# Patient Record
Sex: Female | Born: 1965 | Race: Black or African American | Hispanic: Refuse to answer | Smoking: Never smoker
Health system: Southern US, Community
[De-identification: ages and names within clinical notes are randomized; demographics above are authoritative.]

## PROBLEM LIST (undated history)

## (undated) DIAGNOSIS — F419 Anxiety disorder, unspecified: Secondary | ICD-10-CM

## (undated) DIAGNOSIS — D219 Benign neoplasm of connective and other soft tissue, unspecified: Secondary | ICD-10-CM

## (undated) DIAGNOSIS — E559 Vitamin D deficiency, unspecified: Secondary | ICD-10-CM

## (undated) DIAGNOSIS — I10 Essential (primary) hypertension: Secondary | ICD-10-CM

## (undated) DIAGNOSIS — T7840XA Allergy, unspecified, initial encounter: Secondary | ICD-10-CM

## (undated) DIAGNOSIS — Z9109 Other allergy status, other than to drugs and biological substances: Secondary | ICD-10-CM

## (undated) DIAGNOSIS — N879 Dysplasia of cervix uteri, unspecified: Secondary | ICD-10-CM

## (undated) HISTORY — DX: Dysplasia of cervix uteri, unspecified: N87.9

## (undated) HISTORY — DX: Benign neoplasm of connective and other soft tissue, unspecified: D21.9

## (undated) HISTORY — DX: Allergy, unspecified, initial encounter: T78.40XA

## (undated) HISTORY — DX: Vitamin D deficiency, unspecified: E55.9

## (undated) HISTORY — PX: APPENDECTOMY: SHX54

## (undated) HISTORY — DX: Essential (primary) hypertension: I10

## (undated) HISTORY — DX: Other allergy status, other than to drugs and biological substances: Z91.09

---

## 1990-05-04 HISTORY — PX: LEEP: SHX91

## 1998-01-15 ENCOUNTER — Other Ambulatory Visit: Admission: RE | Admit: 1998-01-15 | Discharge: 1998-01-15 | Payer: Self-pay | Admitting: Obstetrics and Gynecology

## 1999-03-14 ENCOUNTER — Other Ambulatory Visit: Admission: RE | Admit: 1999-03-14 | Discharge: 1999-03-14 | Payer: Self-pay | Admitting: Gynecology

## 1999-09-22 ENCOUNTER — Other Ambulatory Visit: Admission: RE | Admit: 1999-09-22 | Discharge: 1999-09-22 | Payer: Self-pay | Admitting: Gynecology

## 2001-04-19 ENCOUNTER — Other Ambulatory Visit: Admission: RE | Admit: 2001-04-19 | Discharge: 2001-04-19 | Payer: Self-pay | Admitting: Gynecology

## 2002-05-16 ENCOUNTER — Other Ambulatory Visit: Admission: RE | Admit: 2002-05-16 | Discharge: 2002-05-16 | Payer: Self-pay | Admitting: Gynecology

## 2003-03-06 ENCOUNTER — Other Ambulatory Visit: Admission: RE | Admit: 2003-03-06 | Discharge: 2003-03-06 | Payer: Self-pay | Admitting: Gynecology

## 2004-03-11 ENCOUNTER — Other Ambulatory Visit: Admission: RE | Admit: 2004-03-11 | Discharge: 2004-03-11 | Payer: Self-pay | Admitting: Gynecology

## 2005-03-13 ENCOUNTER — Other Ambulatory Visit: Admission: RE | Admit: 2005-03-13 | Discharge: 2005-03-13 | Payer: Self-pay | Admitting: Gynecology

## 2006-03-16 ENCOUNTER — Other Ambulatory Visit: Admission: RE | Admit: 2006-03-16 | Discharge: 2006-03-16 | Payer: Self-pay | Admitting: Gynecology

## 2007-03-18 ENCOUNTER — Other Ambulatory Visit: Admission: RE | Admit: 2007-03-18 | Discharge: 2007-03-18 | Payer: Self-pay | Admitting: Gynecology

## 2008-04-02 ENCOUNTER — Other Ambulatory Visit: Admission: RE | Admit: 2008-04-02 | Discharge: 2008-04-02 | Payer: Self-pay | Admitting: Gynecology

## 2008-04-02 ENCOUNTER — Encounter: Payer: Self-pay | Admitting: Women's Health

## 2008-04-02 ENCOUNTER — Ambulatory Visit: Payer: Self-pay | Admitting: Women's Health

## 2009-04-03 ENCOUNTER — Other Ambulatory Visit: Admission: RE | Admit: 2009-04-03 | Discharge: 2009-04-03 | Payer: Self-pay | Admitting: Gynecology

## 2009-04-03 ENCOUNTER — Ambulatory Visit: Payer: Self-pay | Admitting: Women's Health

## 2010-08-29 ENCOUNTER — Encounter: Payer: Self-pay | Admitting: *Deleted

## 2010-09-01 ENCOUNTER — Other Ambulatory Visit (HOSPITAL_COMMUNITY)
Admission: RE | Admit: 2010-09-01 | Discharge: 2010-09-01 | Disposition: A | Discharge status: Home / Self Care | Payer: BC Managed Care – PPO | Source: Ambulatory Visit | Attending: Obstetrics and Gynecology | Admitting: Obstetrics and Gynecology

## 2010-09-01 ENCOUNTER — Encounter (INDEPENDENT_AMBULATORY_CARE_PROVIDER_SITE_OTHER): Payer: BC Managed Care – PPO | Admitting: Obstetrics and Gynecology

## 2010-09-01 ENCOUNTER — Other Ambulatory Visit: Payer: Self-pay | Admitting: Obstetrics and Gynecology

## 2010-09-01 DIAGNOSIS — Z124 Encounter for screening for malignant neoplasm of cervix: Secondary | ICD-10-CM | POA: Insufficient documentation

## 2010-09-01 DIAGNOSIS — Z01419 Encounter for gynecological examination (general) (routine) without abnormal findings: Secondary | ICD-10-CM

## 2010-09-01 DIAGNOSIS — N912 Amenorrhea, unspecified: Secondary | ICD-10-CM

## 2010-09-02 ENCOUNTER — Ambulatory Visit (INDEPENDENT_AMBULATORY_CARE_PROVIDER_SITE_OTHER): Payer: BC Managed Care – PPO | Admitting: Obstetrics and Gynecology

## 2010-09-02 ENCOUNTER — Other Ambulatory Visit: Payer: BC Managed Care – PPO

## 2010-09-02 DIAGNOSIS — N831 Corpus luteum cyst of ovary, unspecified side: Secondary | ICD-10-CM

## 2010-09-02 DIAGNOSIS — D251 Intramural leiomyoma of uterus: Secondary | ICD-10-CM

## 2010-09-02 DIAGNOSIS — D259 Leiomyoma of uterus, unspecified: Secondary | ICD-10-CM

## 2010-10-03 HISTORY — PX: INTRAUTERINE DEVICE INSERTION: SHX323

## 2010-10-13 ENCOUNTER — Ambulatory Visit (INDEPENDENT_AMBULATORY_CARE_PROVIDER_SITE_OTHER): Payer: BC Managed Care – PPO | Admitting: Obstetrics and Gynecology

## 2010-10-13 ENCOUNTER — Other Ambulatory Visit: Payer: BC Managed Care – PPO

## 2010-10-13 DIAGNOSIS — D252 Subserosal leiomyoma of uterus: Secondary | ICD-10-CM

## 2010-10-13 DIAGNOSIS — Z30431 Encounter for routine checking of intrauterine contraceptive device: Secondary | ICD-10-CM

## 2010-10-13 DIAGNOSIS — T8339XA Other mechanical complication of intrauterine contraceptive device, initial encounter: Secondary | ICD-10-CM

## 2010-10-13 DIAGNOSIS — D259 Leiomyoma of uterus, unspecified: Secondary | ICD-10-CM

## 2010-10-20 ENCOUNTER — Ambulatory Visit: Payer: BC Managed Care – PPO | Admitting: Obstetrics and Gynecology

## 2010-10-21 ENCOUNTER — Ambulatory Visit (INDEPENDENT_AMBULATORY_CARE_PROVIDER_SITE_OTHER): Payer: BC Managed Care – PPO | Admitting: Obstetrics and Gynecology

## 2010-10-21 DIAGNOSIS — N710 Acute inflammatory disease of uterus: Secondary | ICD-10-CM

## 2010-10-21 DIAGNOSIS — R82998 Other abnormal findings in urine: Secondary | ICD-10-CM

## 2010-10-21 DIAGNOSIS — N390 Urinary tract infection, site not specified: Secondary | ICD-10-CM

## 2010-11-25 ENCOUNTER — Encounter: Payer: Self-pay | Admitting: Obstetrics and Gynecology

## 2010-11-25 ENCOUNTER — Ambulatory Visit (INDEPENDENT_AMBULATORY_CARE_PROVIDER_SITE_OTHER): Payer: BC Managed Care – PPO | Admitting: Obstetrics and Gynecology

## 2010-11-25 DIAGNOSIS — N341 Nonspecific urethritis: Secondary | ICD-10-CM | POA: Insufficient documentation

## 2010-11-25 DIAGNOSIS — D251 Intramural leiomyoma of uterus: Secondary | ICD-10-CM

## 2010-11-25 DIAGNOSIS — N946 Dysmenorrhea, unspecified: Secondary | ICD-10-CM | POA: Insufficient documentation

## 2010-11-25 NOTE — Progress Notes (Signed)
The patient came back today for further follow up of her IUD. In addition we wanted to be sure that her urethritis had been resolved with Cipro. The patient told me today that she is very happy with her IUD and her last period was almost nonexistent. She's also found that her cramps are much better. She also stated that her urethral symptoms have completely resolved with a Cipro.  Abdomen is soft without guarding rebound or masses External genitalia within normal limits BUS within normal limits. Vaginal exam within normal limits. Cervix is clean and IUD string is visible. The uterus is still enlarged by myomas to about 10-11 weeks. Adnexa failed to reveal masses. Assessment. Symptoms are much improved with IUD. Urethritis now resolved. Plan patient instructed on how to feel the IUD string. Patient will return at normal time.

## 2011-09-11 ENCOUNTER — Ambulatory Visit (INDEPENDENT_AMBULATORY_CARE_PROVIDER_SITE_OTHER): Payer: PRIVATE HEALTH INSURANCE | Admitting: Obstetrics and Gynecology

## 2011-09-11 ENCOUNTER — Encounter: Payer: Self-pay | Admitting: Obstetrics and Gynecology

## 2011-09-11 ENCOUNTER — Other Ambulatory Visit (HOSPITAL_COMMUNITY)
Admission: RE | Admit: 2011-09-11 | Discharge: 2011-09-11 | Disposition: A | Discharge status: Home / Self Care | Payer: PRIVATE HEALTH INSURANCE | Source: Ambulatory Visit | Attending: Obstetrics and Gynecology | Admitting: Obstetrics and Gynecology

## 2011-09-11 VITALS — BP 132/82 | Ht 64.5 in | Wt 126.0 lb

## 2011-09-11 DIAGNOSIS — Z01419 Encounter for gynecological examination (general) (routine) without abnormal findings: Secondary | ICD-10-CM

## 2011-09-11 DIAGNOSIS — D259 Leiomyoma of uterus, unspecified: Secondary | ICD-10-CM

## 2011-09-11 DIAGNOSIS — D219 Benign neoplasm of connective and other soft tissue, unspecified: Secondary | ICD-10-CM

## 2011-09-11 LAB — CBC WITH DIFFERENTIAL/PLATELET
Basophils Absolute: 0 10*3/uL (ref 0.0–0.1)
Eosinophils Relative: 2 % (ref 0–5)
HCT: 44.2 % (ref 36.0–46.0)
Lymphocytes Relative: 33 % (ref 12–46)
Lymphs Abs: 2 10*3/uL (ref 0.7–4.0)
MCV: 91.3 fL (ref 78.0–100.0)
Monocytes Absolute: 0.4 10*3/uL (ref 0.1–1.0)
Neutro Abs: 3.7 10*3/uL (ref 1.7–7.7)
RBC: 4.84 MIL/uL (ref 3.87–5.11)
RDW: 13.2 % (ref 11.5–15.5)
WBC: 6.2 10*3/uL (ref 4.0–10.5)

## 2011-09-11 LAB — LIPID PANEL: LDL Cholesterol: 119 mg/dL — ABNORMAL HIGH (ref 0–99)

## 2011-09-11 MED ORDER — CITALOPRAM HYDROBROMIDE 20 MG PO TABS: 20.0000 mg | ORAL_TABLET | Freq: Every day | ORAL | Status: DC

## 2011-09-11 MED ORDER — AZITHROMYCIN 250 MG PO TABS: ORAL_TABLET | ORAL | Status: DC

## 2011-09-11 MED ORDER — SPIRONOLACTONE 50 MG PO TABS: 50.0000 mg | ORAL_TABLET | Freq: Every day | ORAL | Status: DC

## 2011-09-11 NOTE — Progress Notes (Signed)
Patient came to see me today for her annual GYN exam. Since we have placed her Mirena IUD her severe menorrhagia and dysmenorrhea has been well-controlled. She has light spotting every 60 days without cramping. She is having no pelvic pain. 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 Since then her Pap smears have been normal. She is having a mammogram today. She is having no other GYN complaints.  Physical examination: Sharon Conrad present. HEENT within normal limits. Neck: Thyroid not large. No masses. Supraclavicular nodes: not enlarged. Breasts: Examined in both sitting and lying  position. No skin changes and no masses. Abdomen: Soft no guarding rebound or masses or hernia. Pelvic: External: Within normal limits. BUS: Within normal limits. Vaginal:within normal limits. Good estrogen effect. No evidence of cystocele rectocele or enterocele. Cervix: clean. IUD string visible. Uterus:enlarged by fibroids to 9-10 week size. Adnexa: No masses. Rectovaginal exam: Confirmatory and negative. Extremities: Within normal limits.  Assessment: Menorrhagia and dysmenorrhea with fibroids-well-controlled with Mirena IUD. CIN-1-status post LEEP.  Plan: Mammogram. Continue Mirena IUD.

## 2011-09-17 ENCOUNTER — Encounter: Payer: Self-pay | Admitting: Obstetrics and Gynecology

## 2011-11-26 ENCOUNTER — Ambulatory Visit (INDEPENDENT_AMBULATORY_CARE_PROVIDER_SITE_OTHER): Payer: PRIVATE HEALTH INSURANCE | Admitting: Physician Assistant

## 2011-11-26 VITALS — BP 110/82 | HR 81 | Temp 98.9°F | Resp 16 | Ht 65.75 in | Wt 129.0 lb

## 2011-11-26 DIAGNOSIS — R059 Cough, unspecified: Secondary | ICD-10-CM

## 2011-11-26 DIAGNOSIS — J040 Acute laryngitis: Secondary | ICD-10-CM

## 2011-11-26 DIAGNOSIS — R05 Cough: Secondary | ICD-10-CM

## 2011-11-26 DIAGNOSIS — J029 Acute pharyngitis, unspecified: Secondary | ICD-10-CM

## 2011-11-26 LAB — POCT RAPID STREP A (OFFICE): Rapid Strep A Screen: NEGATIVE

## 2011-11-26 MED ORDER — AZITHROMYCIN 250 MG PO TABS: ORAL_TABLET | ORAL | Status: AC

## 2011-11-26 MED ORDER — RANITIDINE HCL 150 MG PO TABS: 150.0000 mg | ORAL_TABLET | Freq: Two times a day (BID) | ORAL | Status: DC

## 2011-11-26 MED ORDER — HYDROCODONE-HOMATROPINE 5-1.5 MG/5ML PO SYRP: 5.0000 mL | ORAL_SOLUTION | Freq: Three times a day (TID) | ORAL | Status: AC | PRN

## 2011-11-26 NOTE — Progress Notes (Signed)
  Subjective:    Patient ID: Sharon Conrad, female    DOB: 1965-08-30, 46 y.o.   MRN: [id number]  HPI 46 year old female with 1 day history of sore throat, nasal congestion, slight dry cough, and laryngitis.  She also complains of some ear pressure and fullness.  No fever, chills, nausea, vomiting, headache, or abdominal pain.  She took Zyrtec this a.m as well as used her flonase.  She also admits to having a burning sensation in her throat at night. This is new in onset and she has woken up several times with this sensation and a slight cough.     Review of Systems  All other systems reviewed and are negative.       Objective:   Physical Exam  Constitutional: She is oriented to person, place, and time. She appears well-developed and well-nourished.  HENT:  Head: Normocephalic and atraumatic.  Right Ear: Hearing, tympanic membrane, external ear and ear canal normal.  Left Ear: Hearing, tympanic membrane, external ear and ear canal normal.  Mouth/Throat: Mucous membranes are normal. Posterior oropharyngeal erythema (slight clear postnasal drip) present. No oropharyngeal exudate.  Eyes: Conjunctivae are normal.  Neck: Normal range of motion.  Cardiovascular: Normal rate, regular rhythm and normal heart sounds.   Pulmonary/Chest: Effort normal and breath sounds normal.  Lymphadenopathy:    She has no cervical adenopathy.  Neurological: She is alert and oriented to person, place, and time.  Psychiatric: She has a normal mood and affect. Her behavior is normal. Thought content normal.          Assessment & Plan:   1. Acute pharyngitis  POCT rapid strep A, azithromycin (ZITHROMAX) 250 MG tablet  2. Cough  HYDROcodone-homatropine (HYCODAN) 5-1.5 MG/5ML syrup  3. Laryngitis     Take Hycodan as needed for cough.   Continue Flonase and Zyrtec May start Z-pack in 24-48 hours if no improvement.  Also will start Zantac bid to treat possible GERD.

## 2011-12-11 ENCOUNTER — Ambulatory Visit (INDEPENDENT_AMBULATORY_CARE_PROVIDER_SITE_OTHER): Payer: PRIVATE HEALTH INSURANCE | Admitting: Internal Medicine

## 2011-12-11 VITALS — BP 144/82 | HR 77 | Temp 97.8°F | Resp 16 | Ht 65.0 in | Wt 131.0 lb

## 2011-12-11 DIAGNOSIS — J040 Acute laryngitis: Secondary | ICD-10-CM

## 2011-12-11 DIAGNOSIS — B309 Viral conjunctivitis, unspecified: Secondary | ICD-10-CM

## 2011-12-11 MED ORDER — PREDNISONE 20 MG PO TABS: 20.0000 mg | ORAL_TABLET | Freq: Every day | ORAL | Status: AC

## 2011-12-11 NOTE — Progress Notes (Signed)
  Subjective:    Patient ID: Sharon Conrad, female    DOB: 11-Apr-1966, 46 y.o.   MRN: [id number]  HPI Laryngitis  Onset 1 week ago Mild no assoc symptoms except mild prickly feeling sore throat Improving but still hoarse and wants to be sure she does not need antibiotics.no coughno fever no reflux symptoms taking Flonase and otc allergy med   Review of Systems  Constitutional: Negative.   HENT: Positive for postnasal drip.   Eyes: Negative.   Respiratory: Negative.   Cardiovascular: Negative.   Gastrointestinal: Negative.   Genitourinary: Negative.   Musculoskeletal: Negative.   Skin: Negative.   Neurological: Negative.   Hematological: Negative.   Psychiatric/Behavioral: Negative.   All other systems reviewed and are negative.       Objective:   Physical Exam  Nursing note and vitals reviewed. Constitutional: She is oriented to person, place, and time. She appears well-developed and well-nourished.       Mild largngitis able to vocalize with scrathcy sound  HENT:  Head: Normocephalic and atraumatic.  Right Ear: External ear normal.  Left Ear: External ear normal.  Nose: Nose normal.  Mouth/Throat: Oropharynx is clear and moist.  Eyes: Conjunctivae and EOM are normal. Pupils are equal, round, and reactive to light.  Neck: Normal range of motion. Neck supple.  Cardiovascular: Normal rate, regular rhythm and normal heart sounds.   Pulmonary/Chest: Effort normal and breath sounds normal.  Abdominal: Soft. Bowel sounds are normal.  Musculoskeletal: Normal range of motion.  Neurological: She is alert and oriented to person, place, and time. She has normal reflexes.  Skin: Skin is warm and dry.  Psychiatric: She has a normal mood and affect. Her behavior is normal. Judgment and thought content normal.          Assessment & Plan:  Laryngitis Rx prednisone as directed prilosec as directed otc Vocal rest

## 2011-12-11 NOTE — Patient Instructions (Signed)
Take meds as directed

## 2012-09-19 ENCOUNTER — Other Ambulatory Visit (HOSPITAL_COMMUNITY)
Admission: RE | Admit: 2012-09-19 | Discharge: 2012-09-19 | Disposition: A | Discharge status: Home / Self Care | Payer: BC Managed Care – PPO | Source: Ambulatory Visit | Attending: Gynecology | Admitting: Gynecology

## 2012-09-19 ENCOUNTER — Encounter: Payer: Self-pay | Admitting: Gynecology

## 2012-09-19 ENCOUNTER — Ambulatory Visit (INDEPENDENT_AMBULATORY_CARE_PROVIDER_SITE_OTHER): Payer: BC Managed Care – PPO | Admitting: Gynecology

## 2012-09-19 VITALS — BP 146/90 | Ht 64.25 in | Wt 137.0 lb

## 2012-09-19 DIAGNOSIS — Z23 Encounter for immunization: Secondary | ICD-10-CM

## 2012-09-19 DIAGNOSIS — L708 Other acne: Secondary | ICD-10-CM

## 2012-09-19 DIAGNOSIS — F3289 Other specified depressive episodes: Secondary | ICD-10-CM

## 2012-09-19 DIAGNOSIS — Z1159 Encounter for screening for other viral diseases: Secondary | ICD-10-CM

## 2012-09-19 DIAGNOSIS — Z1151 Encounter for screening for human papillomavirus (HPV): Secondary | ICD-10-CM | POA: Insufficient documentation

## 2012-09-19 DIAGNOSIS — R635 Abnormal weight gain: Secondary | ICD-10-CM

## 2012-09-19 DIAGNOSIS — Z87410 Personal history of cervical dysplasia: Secondary | ICD-10-CM | POA: Insufficient documentation

## 2012-09-19 DIAGNOSIS — F329 Major depressive disorder, single episode, unspecified: Secondary | ICD-10-CM

## 2012-09-19 DIAGNOSIS — Z01419 Encounter for gynecological examination (general) (routine) without abnormal findings: Secondary | ICD-10-CM

## 2012-09-19 DIAGNOSIS — Z800 Family history of malignant neoplasm of digestive organs: Secondary | ICD-10-CM

## 2012-09-19 DIAGNOSIS — F32A Depression, unspecified: Secondary | ICD-10-CM | POA: Insufficient documentation

## 2012-09-19 DIAGNOSIS — L709 Acne, unspecified: Secondary | ICD-10-CM

## 2012-09-19 DIAGNOSIS — D259 Leiomyoma of uterus, unspecified: Secondary | ICD-10-CM

## 2012-09-19 LAB — CBC WITH DIFFERENTIAL/PLATELET
Basophils Relative: 0 % (ref 0–1)
Eosinophils Absolute: 0.2 10*3/uL (ref 0.0–0.7)
Lymphs Abs: 1.4 10*3/uL (ref 0.7–4.0)
MCH: 30.4 pg (ref 26.0–34.0)
Neutrophils Relative %: 71 % (ref 43–77)
Platelets: 182 10*3/uL (ref 150–400)
RBC: 4.81 MIL/uL (ref 3.87–5.11)

## 2012-09-19 LAB — COMPREHENSIVE METABOLIC PANEL
ALT: 12 U/L (ref 0–35)
AST: 19 U/L (ref 0–37)
Alkaline Phosphatase: 54 U/L (ref 39–117)
Glucose, Bld: 75 mg/dL (ref 70–99)
Sodium: 138 mEq/L (ref 135–145)
Total Bilirubin: 0.4 mg/dL (ref 0.3–1.2)
Total Protein: 7 g/dL (ref 6.0–8.3)

## 2012-09-19 LAB — LIPID PANEL
Cholesterol: 208 mg/dL — ABNORMAL HIGH (ref 0–200)
LDL Cholesterol: 131 mg/dL — ABNORMAL HIGH (ref 0–99)
Total CHOL/HDL Ratio: 3.3 Ratio
VLDL: 13 mg/dL (ref 0–40)

## 2012-09-19 LAB — HEPATITIS C ANTIBODY: HCV Ab: NEGATIVE

## 2012-09-19 MED ORDER — SPIRONOLACTONE 50 MG PO TABS: 50.0000 mg | ORAL_TABLET | Freq: Every day | ORAL | Status: DC

## 2012-09-19 MED ORDER — CITALOPRAM HYDROBROMIDE 20 MG PO TABS: 20.0000 mg | ORAL_TABLET | Freq: Every day | ORAL | Status: DC

## 2012-09-19 NOTE — Progress Notes (Signed)
Sharon Conrad 05-28-1965 [id number]   History:    47 y.o.  for annual gyn exam with no major complaints. Patient had a Mirena IUD placed in 2012. She has minimal if any menstrual cycle. She had complained of some weight gain since last year. 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 Since then her Pap smears have been normal. Patient had a mammogram in 2013 which demonstrated she had extremely dense breasts and is scheduled for mammogram later this week. She does her monthly breast examination. She takes Aldactone 50 mg daily for her acne and she also takes Cymbalta 20 mg daily for depression. Her mother was diagnosed with colon cancer at the age of 70. Patient would know prior colonoscopy. She does have history of fibroid uterus.   Past medical history,surgical history, family history and social history were all reviewed and documented in the EPIC chart.  Gynecologic History No LMP recorded. Patient is not currently having periods (Reason: IUD). Contraception: IUD Last Pap: 2013. Results were: normal Last mammogram: 2013. Results were: normal but dense  Obstetric History OB History   Grav Para Term Preterm Abortions TAB SAB Ect Mult Living   0                ROS: A ROS was performed and pertinent positives and negatives are included in the history.  GENERAL: No fevers or chills. HEENT: No change in vision, no earache, sore throat or sinus congestion. NECK: No pain or stiffness. CARDIOVASCULAR: No chest pain or pressure. No palpitations. PULMONARY: No shortness of breath, cough or wheeze. GASTROINTESTINAL: No abdominal pain, nausea, vomiting or diarrhea, melena or bright red blood per rectum. GENITOURINARY: No urinary frequency, urgency, hesitancy or dysuria. MUSCULOSKELETAL: No joint or muscle pain, no back pain, no recent trauma. DERMATOLOGIC: No rash, no itching, no lesions. ENDOCRINE: No polyuria, polydipsia, no heat or cold intolerance. No recent change  in weight. HEMATOLOGICAL: No anemia or easy bruising or bleeding. NEUROLOGIC: No headache, seizures, numbness, tingling or weakness. PSYCHIATRIC: No depression, no loss of interest in normal activity or change in sleep pattern.     Exam: chaperone present  BP 146/90  Ht 5' 4.25" (1.632 m)  Wt 137 lb (62.143 kg)  BMI 23.33 kg/m2  Body mass index is 23.33 kg/(m^2).  General appearance : Well developed well nourished female. No acute distress HEENT: Neck supple, trachea midline, no carotid bruits, no thyroidmegaly Lungs: Clear to auscultation, no rhonchi or wheezes, or rib retractions  Heart: Regular rate and rhythm, no murmurs or gallops Breast:Examined in sitting and supine position were symmetrical in appearance, no palpable masses or tenderness,  no skin retraction, no nipple inversion, no nipple discharge, no skin discoloration, no axillary or supraclavicular lymphadenopathy Abdomen: no palpable masses or tenderness, no rebound or guarding Extremities: no edema or skin discoloration or tenderness  Pelvic:  Bartholin, Urethra, Skene Glands: Within normal limits             Vagina: No gross lesions or discharge  Cervix: No gross lesions or discharge, IUD string not seen  Uterus  Slightly anteverted 8-10 week size irregular shaped,   Adnexa  Without masses or tenderness  Anus and perineum  normal   Rectovaginal  normal sphincter tone without palpated masses or tenderness             Hemoccult cards provided   New CDC guidelines is recommending patients be tested once in her lifetime  for hepatitis C antibody who were born between [birth year] through 1967. This was discussed with the patient today and has agreed to be tested today.  Assessment/Plan:  47 y.o. female for annual exam will be asked to return to the office in one week for followup ultrasound as to her fibroids and also to confirm positioning of the IUD since the string was not seen today. Patient was instructed that on her  mammogram to tell the radiologist that she would like a 3-D because of her dense breasts noted on mammogram in 2013. She was reminded to do her monthly self breast examination. Hemoccult card was provided for to submit to the office for testing. Colonoscopy will be schedule next year. The following labs were ordered today: Fasting lipid profile, CBC, TSH, comprehensive metabolic panel, CBC and Pap smear. Patient did receive the Tdap vaccine today and literature information was provided.    Ok Edwards MD, 9:31 AM 09/19/2012

## 2012-09-19 NOTE — Progress Notes (Signed)
No menses due to iud 

## 2012-09-19 NOTE — Patient Instructions (Signed)

## 2012-09-19 NOTE — Addendum Note (Signed)
Addended by: Bertram Savin A on: 09/19/2012 09:43 AM   Modules accepted: Orders

## 2012-09-20 ENCOUNTER — Encounter: Payer: Self-pay | Admitting: Obstetrics and Gynecology

## 2012-09-20 ENCOUNTER — Encounter: Payer: Self-pay | Admitting: Gynecology

## 2012-09-20 LAB — URINALYSIS W MICROSCOPIC + REFLEX CULTURE
Hgb urine dipstick: NEGATIVE
Leukocytes, UA: NEGATIVE
Nitrite: NEGATIVE
Protein, ur: NEGATIVE mg/dL
pH: 7 (ref 5.0–8.0)

## 2012-09-21 ENCOUNTER — Other Ambulatory Visit: Payer: Self-pay | Admitting: Gynecology

## 2012-09-21 ENCOUNTER — Telehealth: Payer: Self-pay

## 2012-09-21 DIAGNOSIS — E789 Disorder of lipoprotein metabolism, unspecified: Secondary | ICD-10-CM

## 2012-09-21 MED ORDER — FLUTICASONE PROPIONATE 50 MCG/ACT NA SUSP: 2.0000 | Freq: Every day | NASAL | Status: DC

## 2012-09-21 NOTE — Telephone Encounter (Signed)
E-scribed to pharmacy

## 2012-09-21 NOTE — Telephone Encounter (Signed)
OK to refill her Flonase

## 2012-09-21 NOTE — Telephone Encounter (Signed)
Patient had her yearly on Monday and forgot to ask you if you would refill her generic Flonase nasal spray.  Ok to refill?

## 2012-09-22 ENCOUNTER — Encounter: Payer: Self-pay | Admitting: Gynecology

## 2012-09-22 ENCOUNTER — Ambulatory Visit (INDEPENDENT_AMBULATORY_CARE_PROVIDER_SITE_OTHER): Payer: BC Managed Care – PPO | Admitting: Gynecology

## 2012-09-22 VITALS — BP 130/80 | Temp 98.5°F

## 2012-09-22 DIAGNOSIS — J019 Acute sinusitis, unspecified: Secondary | ICD-10-CM

## 2012-09-22 MED ORDER — FLUCONAZOLE 100 MG PO TABS: 100.0000 mg | ORAL_TABLET | Freq: Every day | ORAL | Status: DC

## 2012-09-22 MED ORDER — CEFUROXIME AXETIL 500 MG PO TABS: ORAL_TABLET | ORAL | Status: DC

## 2012-09-22 NOTE — Progress Notes (Signed)
Patient presented to the office today complaining of postnasal drainage and feeling stopped up and pressure behind her eyes. She denied any fever chills nausea or vomiting. Very minimal nonproductive cough.  Exam: HEENT: Tender frontal and maxillary sinuses. No oropharyngeal lesions. Ear canal and TM clear on both ears. No submandibular lymphadenopathy  Lungs: Clear to auscultation Rogers or wheezes Heart: Regular rate and rhythm no murmurs or gallops  Assessment/plan: Acute sinusitis and she'll be treated with Ceftin 500 mg one by mouth twice a day along with Flonase nasal spray to apply to each nostril twice a day.

## 2012-09-22 NOTE — Patient Instructions (Addendum)

## 2012-09-23 ENCOUNTER — Other Ambulatory Visit: Payer: Self-pay | Admitting: Gynecology

## 2012-09-23 ENCOUNTER — Telehealth: Payer: Self-pay

## 2012-09-23 MED ORDER — CEFUROXIME AXETIL 500 MG PO TABS: 500.0000 mg | ORAL_TABLET | Freq: Two times a day (BID) | ORAL | Status: DC

## 2012-09-23 NOTE — Telephone Encounter (Signed)
Please clarify with pharmacyas follows:  Ceftin 500 mg one by mouth twice a day  For one week (14) along with Flonase nasal spray to apply to each nostril twice a day when necessary.

## 2012-09-23 NOTE — Telephone Encounter (Signed)
Patient was in yesterday for sinus infection.  She went to pick up antibiotic Rx last night but Rx was for one pill.  She said she was fairly certain you had told her it would be for seven day.  Please advise.

## 2012-09-23 NOTE — Telephone Encounter (Signed)
New Rx sent to pharmacy and clarified with patient.

## 2012-10-03 ENCOUNTER — Telehealth: Payer: Self-pay

## 2012-10-03 ENCOUNTER — Other Ambulatory Visit: Payer: Self-pay | Admitting: Gynecology

## 2012-10-03 MED ORDER — AZITHROMYCIN 250 MG PO TABS: ORAL_TABLET | ORAL | Status: DC

## 2012-10-03 NOTE — Telephone Encounter (Deleted)
Patient was seen 09/22/12

## 2012-10-03 NOTE — Telephone Encounter (Signed)
Recommend Z-Pak. If continues then needs to followup with primary physician or urgent care.

## 2012-10-03 NOTE — Telephone Encounter (Signed)
Patient was seen by Dr. Glenetta Hew on 09/22/12 and diagnosed with sinus infection and prescribed Ceftin 500mg  bid x one week and Flonase spray. Patient has finished antibiotic but still feels she has sinus infection and is blowing green mucous.  She asked if she could have another week on antibiotic?

## 2012-10-03 NOTE — Telephone Encounter (Signed)
Patient advised. rx e-scribed.

## 2012-10-17 ENCOUNTER — Other Ambulatory Visit: Payer: BC Managed Care – PPO

## 2012-10-17 ENCOUNTER — Ambulatory Visit: Payer: BC Managed Care – PPO | Admitting: Gynecology

## 2012-10-18 ENCOUNTER — Other Ambulatory Visit: Payer: Self-pay | Admitting: Anesthesiology

## 2012-10-18 DIAGNOSIS — Z1211 Encounter for screening for malignant neoplasm of colon: Secondary | ICD-10-CM

## 2012-10-19 ENCOUNTER — Ambulatory Visit (INDEPENDENT_AMBULATORY_CARE_PROVIDER_SITE_OTHER): Payer: BC Managed Care – PPO

## 2012-10-19 ENCOUNTER — Encounter: Payer: Self-pay | Admitting: Gynecology

## 2012-10-19 ENCOUNTER — Ambulatory Visit (INDEPENDENT_AMBULATORY_CARE_PROVIDER_SITE_OTHER): Payer: BC Managed Care – PPO | Admitting: Gynecology

## 2012-10-19 VITALS — BP 118/70

## 2012-10-19 DIAGNOSIS — D251 Intramural leiomyoma of uterus: Secondary | ICD-10-CM

## 2012-10-19 DIAGNOSIS — D252 Subserosal leiomyoma of uterus: Secondary | ICD-10-CM

## 2012-10-19 DIAGNOSIS — D259 Leiomyoma of uterus, unspecified: Secondary | ICD-10-CM

## 2012-10-19 DIAGNOSIS — Z5189 Encounter for other specified aftercare: Secondary | ICD-10-CM

## 2012-10-19 DIAGNOSIS — T8332XD Displacement of intrauterine contraceptive device, subsequent encounter: Secondary | ICD-10-CM

## 2012-10-19 DIAGNOSIS — N852 Hypertrophy of uterus: Secondary | ICD-10-CM

## 2012-10-19 NOTE — Progress Notes (Signed)
47 year old patient was seen in the office on May 19 for her annual exam with no complaints.patient had a Mirena IUD placed in 2012. She has minimal if any menstrual cycle. She had complained of some weight gain since last year. 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 Since then her Pap smears have been normal. The patient went known history of leiomyomatous uteri was asked to come to the office today for an ultrasound to compare with previous studies and to confirm placement of the IUD.  Ultrasound: Uterus measuring 10.3 x 9.1 x 6.1 cm with endometrial stripe at 2.8 mm. Patient with several intramural and subserosal myomas. Her largest fibroid was a left pedunculated fibroid measuring 4.2 x 3.2 cm. Both right and left ovary were normal. IUD was in normal position.  Patient's recent Pap smear, comprehensive metabolic panel, TSH, hepatitis C, urinalysis, blood sugar were all normal. Her lipid profile did demonstrate slight elevation in LDL was evaluated 31 and total cholesterol 208. Patient currently works on a diet and exercise and will have a fasting lipid profile in 6 months.  Assessment/plan: Patient with a symptomatic leiomyomatous uteri. Ultrasound compared with several years ago. The fibroids are beginning to get smaller since she is approaching the perimenopause. IUD was in place. Patient otherwise scheduled to return back in 1 year or when necessary.

## 2013-09-20 ENCOUNTER — Encounter: Payer: BC Managed Care – PPO | Admitting: Gynecology

## 2013-09-21 ENCOUNTER — Ambulatory Visit (INDEPENDENT_AMBULATORY_CARE_PROVIDER_SITE_OTHER): Payer: BC Managed Care – PPO | Admitting: Gynecology

## 2013-09-21 ENCOUNTER — Encounter: Payer: Self-pay | Admitting: Gynecology

## 2013-09-21 ENCOUNTER — Other Ambulatory Visit (HOSPITAL_COMMUNITY)
Admission: RE | Admit: 2013-09-21 | Discharge: 2013-09-21 | Disposition: A | Discharge status: Home / Self Care | Payer: BC Managed Care – PPO | Source: Ambulatory Visit | Attending: Gynecology | Admitting: Gynecology

## 2013-09-21 VITALS — BP 138/82 | Ht 64.5 in | Wt 134.0 lb

## 2013-09-21 DIAGNOSIS — F411 Generalized anxiety disorder: Secondary | ICD-10-CM

## 2013-09-21 DIAGNOSIS — Z87410 Personal history of cervical dysplasia: Secondary | ICD-10-CM

## 2013-09-21 DIAGNOSIS — Z800 Family history of malignant neoplasm of digestive organs: Secondary | ICD-10-CM

## 2013-09-21 DIAGNOSIS — F329 Major depressive disorder, single episode, unspecified: Secondary | ICD-10-CM

## 2013-09-21 DIAGNOSIS — Z1151 Encounter for screening for human papillomavirus (HPV): Secondary | ICD-10-CM | POA: Insufficient documentation

## 2013-09-21 DIAGNOSIS — F32A Depression, unspecified: Secondary | ICD-10-CM

## 2013-09-21 DIAGNOSIS — F419 Anxiety disorder, unspecified: Secondary | ICD-10-CM

## 2013-09-21 DIAGNOSIS — R6882 Decreased libido: Secondary | ICD-10-CM

## 2013-09-21 DIAGNOSIS — D259 Leiomyoma of uterus, unspecified: Secondary | ICD-10-CM

## 2013-09-21 DIAGNOSIS — Z124 Encounter for screening for malignant neoplasm of cervix: Secondary | ICD-10-CM | POA: Insufficient documentation

## 2013-09-21 DIAGNOSIS — Z01419 Encounter for gynecological examination (general) (routine) without abnormal findings: Secondary | ICD-10-CM

## 2013-09-21 DIAGNOSIS — F3289 Other specified depressive episodes: Secondary | ICD-10-CM

## 2013-09-21 LAB — LIPID PANEL
CHOL/HDL RATIO: 3.1 ratio
Cholesterol: 204 mg/dL — ABNORMAL HIGH (ref 0–200)
HDL: 66 mg/dL (ref >39)
LDL Cholesterol: 127 mg/dL — ABNORMAL HIGH (ref 0–99)
Triglycerides: 57 mg/dL (ref <150)
VLDL: 11 mg/dL (ref 0–40)

## 2013-09-21 LAB — CBC WITH DIFFERENTIAL/PLATELET
Basophils Absolute: 0 10*3/uL (ref 0.0–0.1)
Basophils Relative: 0 % (ref 0–1)
Eosinophils Absolute: 0.1 10*3/uL (ref 0.0–0.7)
Eosinophils Relative: 2 % (ref 0–5)
HCT: 42.9 % (ref 36.0–46.0)
Hemoglobin: 14.9 g/dL (ref 12.0–15.0)
LYMPHS ABS: 1.6 10*3/uL (ref 0.7–4.0)
LYMPHS PCT: 25 % (ref 12–46)
MCH: 30.8 pg (ref 26.0–34.0)
MCHC: 34.7 g/dL (ref 30.0–36.0)
MCV: 88.6 fL (ref 78.0–100.0)
Monocytes Absolute: 0.4 10*3/uL (ref 0.1–1.0)
Monocytes Relative: 6 % (ref 3–12)
NEUTROS ABS: 4.2 10*3/uL (ref 1.7–7.7)
NEUTROS PCT: 67 % (ref 43–77)
PLATELETS: 184 10*3/uL (ref 150–400)
RBC: 4.84 MIL/uL (ref 3.87–5.11)
RDW: 14.2 % (ref 11.5–15.5)
WBC: 6.2 10*3/uL (ref 4.0–10.5)

## 2013-09-21 LAB — COMPREHENSIVE METABOLIC PANEL
ALT: 15 U/L (ref 0–35)
AST: 19 U/L (ref 0–37)
Albumin: 4.2 g/dL (ref 3.5–5.2)
Alkaline Phosphatase: 49 U/L (ref 39–117)
BUN: 9 mg/dL (ref 6–23)
CALCIUM: 9.5 mg/dL (ref 8.4–10.5)
CHLORIDE: 102 meq/L (ref 96–112)
CO2: 26 meq/L (ref 19–32)
Creat: 0.71 mg/dL (ref 0.50–1.10)
Glucose, Bld: 76 mg/dL (ref 70–99)
POTASSIUM: 3.9 meq/L (ref 3.5–5.3)
SODIUM: 136 meq/L (ref 135–145)
TOTAL PROTEIN: 7 g/dL (ref 6.0–8.3)
Total Bilirubin: 0.6 mg/dL (ref 0.2–1.2)

## 2013-09-21 LAB — TSH: TSH: 2.841 u[IU]/mL (ref 0.350–4.500)

## 2013-09-21 MED ORDER — SPIRONOLACTONE 50 MG PO TABS: 50.0000 mg | ORAL_TABLET | Freq: Every day | ORAL | Status: DC

## 2013-09-21 MED ORDER — ALPRAZOLAM 0.25 MG PO TABS: 0.2500 mg | ORAL_TABLET | Freq: Every evening | ORAL | Status: DC | PRN

## 2013-09-21 MED ORDER — CITALOPRAM HYDROBROMIDE 10 MG PO TABS: 10.0000 mg | ORAL_TABLET | Freq: Every day | ORAL | Status: DC

## 2013-09-21 NOTE — Progress Notes (Signed)
Sharon Conrad August 20, 1965 [id number]   History:    48 y.o.  for annual gyn exam with and decreased libido. Patient is currently taking Celexa 20 mg daily for depression was prescribed by her PCP in another city a few years ago. She also has been taking Aldactone 50 mg daily which has helped with her acne. Patient's mother had colon cancer the age of 4. Patient has not had a colonoscopy as of yet.  Review of patient's records indicated that she had a LEEP in 1992 for CIN and had one other Pap showing CIN in 1995. Since then her Pap smears have been normal. Patient had a Mirena IUD placed in 2012 whose done well and is having no menstrual cycles. She does have history of a fibroid uterus.   Past medical history,surgical history, family history and social history were all reviewed and documented in the EPIC chart.  Gynecologic History No LMP recorded. Patient is not currently having periods (Reason: IUD). Contraception: IUD Last Pap: 2014. Results were: normal Last mammogram: 2014. Results were: normal  Obstetric History OB History  Gravida Para Term Preterm AB SAB TAB Ectopic Multiple Living  0                  ROS: A ROS was performed and pertinent positives and negatives are included in the history.  GENERAL: No fevers or chills. HEENT: No change in vision, no earache, sore throat or sinus congestion. NECK: No pain or stiffness. CARDIOVASCULAR: No chest pain or pressure. No palpitations. PULMONARY: No shortness of breath, cough or wheeze. GASTROINTESTINAL: No abdominal pain, nausea, vomiting or diarrhea, melena or bright red blood per rectum. GENITOURINARY: No urinary frequency, urgency, hesitancy or dysuria. MUSCULOSKELETAL: No joint or muscle pain, no back pain, no recent trauma. DERMATOLOGIC: No rash, no itching, no lesions. ENDOCRINE: No polyuria, polydipsia, no heat or cold intolerance. No recent change in weight. HEMATOLOGICAL: No anemia or easy bruising or bleeding.  NEUROLOGIC: No headache, seizures, numbness, tingling or weakness. PSYCHIATRIC: No depression, no loss of interest in normal activity or change in sleep pattern.     Exam: chaperone present  BP 138/82  Ht 5' 4.5" (1.638 m)  Wt 134 lb (60.782 kg)  BMI 22.65 kg/m2  Body mass index is 22.65 kg/(m^2).  General appearance : Well developed well nourished female. No acute distress HEENT: Neck supple, trachea midline, no carotid bruits, no thyroidmegaly Lungs: Clear to auscultation, no rhonchi or wheezes, or rib retractions  Heart: Regular rate and rhythm, no murmurs or gallops Breast:Examined in sitting and supine position were symmetrical in appearance, no palpable masses or tenderness,  no skin retraction, no nipple inversion, no nipple discharge, no skin discoloration, no axillary or supraclavicular lymphadenopathy Abdomen: no palpable masses or tenderness, no rebound or guarding Extremities: no edema or skin discoloration or tenderness  Pelvic:  Bartholin, Urethra, Skene Glands: Within normal limits             Vagina: No gross lesions or discharge  Cervix: No gross lesions or discharge, IUD string not seen  Uterus  8-10 weeks size irregular non-tender and mobile  Adnexa  Without masses or tenderness  Anus and perineum  normal   Rectovaginal  normal sphincter tone without palpated masses or tenderness             Hemoccult patient was scheduled colonoscopy this year     Assessment/Plan:  47 y.o. female for annual exam who needs to schedule her colonoscopy neither  she is 29 and especially since her mother had colon cancer at the age of 74. I will give her the name of my GI colleague for her to schedule accordingly. Also patient was to be established with an internist and her community and needs will be provided as well. The following labs were ordered today: Fasting lipid profile, comprehensive metabolic panel, TSH, CBC, and urinalysis. Pap smear was done today. She scheduled for  mammogram later this week. We discussed importance of calcium and vitamin D for osteoporosis prevention. Patient was screened last year for hepatitis C and was negative as per recommendations of the CDC. We're going to decrease her Celexa from 20 mg to 10 mg to see if this will help with her libido. Will also schedule an ultrasound here in the office the next couple of weeks to make sure the IUD still in place and that there has been no major changes in her fibroid uterus.  Note: This dictation was prepared with  Dragon/digital dictation along withSmart phrase technology. Any transcriptional errors that result from this process are unintentional.   Terrance Mass MD, 9:57 AM 09/21/2013

## 2013-09-21 NOTE — Patient Instructions (Signed)

## 2013-09-22 LAB — URINALYSIS W MICROSCOPIC + REFLEX CULTURE
Bilirubin Urine: NEGATIVE
Casts: NONE SEEN
Crystals: NONE SEEN
Glucose, UA: NEGATIVE mg/dL
HGB URINE DIPSTICK: NEGATIVE
KETONES UR: NEGATIVE mg/dL
NITRITE: NEGATIVE
PROTEIN: NEGATIVE mg/dL
Specific Gravity, Urine: 1.005 (ref 1.005–1.030)
UROBILINOGEN UA: 0.2 mg/dL (ref 0.0–1.0)
pH: 6 (ref 5.0–8.0)

## 2013-09-22 LAB — HEPATITIS C ANTIBODY: HCV AB: NEGATIVE

## 2013-09-23 LAB — URINE CULTURE

## 2013-09-26 ENCOUNTER — Encounter: Payer: Self-pay | Admitting: Gynecology

## 2013-09-26 ENCOUNTER — Other Ambulatory Visit: Payer: Self-pay | Admitting: Gynecology

## 2013-09-26 DIAGNOSIS — E7800 Pure hypercholesterolemia, unspecified: Secondary | ICD-10-CM

## 2013-10-05 ENCOUNTER — Ambulatory Visit (INDEPENDENT_AMBULATORY_CARE_PROVIDER_SITE_OTHER): Payer: BC Managed Care – PPO | Admitting: Gynecology

## 2013-10-05 ENCOUNTER — Encounter: Payer: Self-pay | Admitting: Gynecology

## 2013-10-05 ENCOUNTER — Other Ambulatory Visit: Payer: Self-pay | Admitting: Gynecology

## 2013-10-05 ENCOUNTER — Ambulatory Visit (INDEPENDENT_AMBULATORY_CARE_PROVIDER_SITE_OTHER): Payer: BC Managed Care – PPO

## 2013-10-05 VITALS — BP 128/74

## 2013-10-05 DIAGNOSIS — N852 Hypertrophy of uterus: Secondary | ICD-10-CM

## 2013-10-05 DIAGNOSIS — D251 Intramural leiomyoma of uterus: Secondary | ICD-10-CM

## 2013-10-05 DIAGNOSIS — IMO0002 Reserved for concepts with insufficient information to code with codable children: Secondary | ICD-10-CM

## 2013-10-05 DIAGNOSIS — D259 Leiomyoma of uterus, unspecified: Secondary | ICD-10-CM

## 2013-10-05 DIAGNOSIS — R102 Pelvic and perineal pain: Secondary | ICD-10-CM

## 2013-10-05 DIAGNOSIS — N9489 Other specified conditions associated with female genital organs and menstrual cycle: Secondary | ICD-10-CM

## 2013-10-05 DIAGNOSIS — D252 Subserosal leiomyoma of uterus: Secondary | ICD-10-CM

## 2013-10-05 DIAGNOSIS — N83209 Unspecified ovarian cyst, unspecified side: Secondary | ICD-10-CM

## 2013-10-05 DIAGNOSIS — R6889 Other general symptoms and signs: Secondary | ICD-10-CM

## 2013-10-05 DIAGNOSIS — N83202 Unspecified ovarian cyst, left side: Secondary | ICD-10-CM | POA: Insufficient documentation

## 2013-10-05 NOTE — Patient Instructions (Signed)
Uterine Fibroid A uterine fibroid is a growth (tumor) that occurs in your uterus. This type of tumor is not cancerous and does not spread out of the uterus. You can have one or many fibroids. Fibroids can vary in size, weight, and where they grow in the uterus. Some can become quite large. Most fibroids do not require medical treatment, but some can cause pain or heavy bleeding during and between periods. CAUSES  A fibroid is the result of a single uterine cell that keeps growing (unregulated), which is different than most cells in the human body. Most cells have a control mechanism that keeps them from reproducing without control.  SIGNS AND SYMPTOMS   Bleeding.  Pelvic pain and pressure.  Bladder problems due to the size of the fibroid.  Infertility and miscarriages depending on the size and location of the fibroid. DIAGNOSIS  Uterine fibroids are diagnosed through a physical exam. Your health care provider may feel the lumpy tumors during a pelvic exam. Ultrasonography may be done to get information regarding size, location, and number of tumors.  TREATMENT   Your health care provider may recommend watchful waiting. This involves getting the fibroid checked by your health care provider to see if it grows or shrinks.   Hormone treatment or an intrauterine device (IUD) may be prescribed.   Surgery may be needed to remove the fibroids (myomectomy) or the uterus (hysterectomy). This depends on your situation. When fibroids interfere with fertility and a woman wants to become pregnant, a health care provider may recommend having the fibroids removed.  [place] care depends on how you were treated. In general:   Keep all follow-up appointments with your health care provider.   Only take over-the-counter or prescription medicines as directed by your health care provider. If you were prescribed a hormone treatment, take the hormone medicines exactly as directed. Do not  take aspirin. It can cause bleeding.   Talk to your health care provider about taking iron pills.  If your periods are troublesome but not so heavy, lie down with your feet raised slightly above your heart. Place cold packs on your lower abdomen.   If your periods are heavy, write down the number of pads or tampons you use per month. Bring this information to your health care provider.   Include green vegetables in your diet.  SEEK IMMEDIATE MEDICAL CARE IF:  You have pelvic pain or cramps not controlled with medicines.   You have a sudden increase in pelvic pain.   You have an increase in bleeding between and during periods.   You have excessive periods and soak tampons or pads in a half hour or less.  You feel lightheaded or have fainting episodes. Document Released: 04/17/2000 Document Revised: 02/08/2013 Document Reviewed: 11/17/2012 [facility] Patient Information 2014 [place], [place]. Ovarian Cyst An ovarian cyst is a fluid-filled sac that forms on an ovary. The ovaries are small organs that produce eggs in women. Various types of cysts can form on the ovaries. Most are not cancerous. Many do not cause problems, and they often go away on their own. Some may cause symptoms and require treatment. Common types of ovarian cysts include:  Functional cysts These cysts may occur every month during the menstrual cycle. This is normal. The cysts usually go away with the next menstrual cycle if the woman does not get pregnant. Usually, there are no symptoms with a functional cyst.  Endometrioma cysts These cysts form from the tissue that  lines the uterus. They are also called "chocolate cysts" because they become filled with blood that turns brown. This type of cyst can cause pain in the lower abdomen during intercourse and with your menstrual period.  Cystadenoma cysts This type develops from the cells on the outside of the ovary. These cysts can get very big and cause lower abdomen  pain and pain with intercourse. This type of cyst can twist on itself, cut off its blood supply, and cause severe pain. It can also easily rupture and cause a lot of pain.  Dermoid cysts This type of cyst is sometimes found in both ovaries. These cysts may contain different kinds of body tissue, such as skin, teeth, hair, or cartilage. They usually do not cause symptoms unless they get very big.  Theca lutein cysts These cysts occur when too much of a certain hormone (human chorionic gonadotropin) is produced and overstimulates the ovaries to produce an egg. This is most common after procedures used to assist with the conception of a baby (in vitro fertilization). CAUSES   Fertility drugs can cause a condition in which multiple large cysts are formed on the ovaries. This is called ovarian hyperstimulation syndrome.  A condition called polycystic ovary syndrome can cause hormonal imbalances that can lead to nonfunctional ovarian cysts. SIGNS AND SYMPTOMS  Many ovarian cysts do not cause symptoms. If symptoms are present, they may include:  Pelvic pain or pressure.  Pain in the lower abdomen.  Pain during sexual intercourse.  Increasing girth (swelling) of the abdomen.  Abnormal menstrual periods.  Increasing pain with menstrual periods.  Stopping having menstrual periods without being pregnant. DIAGNOSIS  These cysts are commonly found during a routine or annual pelvic exam. Tests may be ordered to find out more about the cyst. These tests may include:  Ultrasound.  X-ray of the pelvis.  CT scan.  MRI.  Blood tests. TREATMENT  Many ovarian cysts go away on their own without treatment. Your health care provider may want to check your cyst regularly for 2 3 months to see if it changes. For women in menopause, it is particularly important to monitor a cyst closely because of the higher rate of ovarian cancer in menopausal women. When treatment is needed, it may include any of the  following:  A procedure to drain the cyst (aspiration). This may be done using a long needle and ultrasound. It can also be done through a laparoscopic procedure. This involves using a thin, lighted tube with a tiny camera on the end (laparoscope) inserted through a small incision.  Surgery to remove the whole cyst. This may be done using laparoscopic surgery or an open surgery involving a larger incision in the lower abdomen.  Hormone treatment or birth control pills. These methods are sometimes used to help dissolve a cyst. HOME CARE INSTRUCTIONS   Only take over-the-counter or prescription medicines as directed by your health care provider.  Follow up with your health care provider as directed.  Get regular pelvic exams and Pap tests. SEEK MEDICAL CARE IF:   Your periods are late, irregular, or painful, or they stop.  Your pelvic pain or abdominal pain does not go away.  Your abdomen becomes larger or swollen.  You have pressure on your bladder or trouble emptying your bladder completely.  You have pain during sexual intercourse.  You have feelings of fullness, pressure, or discomfort in your stomach.  You lose weight for no apparent reason.  You feel generally ill.  You become constipated.  You lose your appetite.  You develop acne.  You have an increase in body and facial hair.  You are gaining weight, without changing your exercise and eating habits.  You think you are pregnant. SEEK IMMEDIATE MEDICAL CARE IF:   You have increasing abdominal pain.  You feel sick to your stomach (nauseous), and you throw up (vomit).  You develop a fever that comes on suddenly.  You have abdominal pain during a bowel movement.  Your menstrual periods become heavier than usual. Document Released: 04/20/2005 Document Revised: 02/08/2013 Document Reviewed: 12/26/2012 [facility] Patient Information 2014 [place].

## 2013-10-05 NOTE — Progress Notes (Signed)
   The patient was seen in the office for annual exam on 09/21/2013 and is here in the office today to discuss her ultrasound report for followup since she has history of fibroid uterus and an IUD in place. Also discussed her lab work. Her ultrasound demonstrated the following:  Uterus measuring 10.2 x 8.5 x 6.1 cm with endometrial stripe of 2.7 mm. Patient with 8 intramural fibroids the largest one measuring 59 x 50 mm. In comparison with last year fibroid uterus has remained essentially stable. Right ovary was normal but a left sidewall echo-free cyst measuring 39 x 43 x 21 mm was noted an echogenic focus of 5 mm was noted the wall of the cyst and vascular no fluid in the cul-de-sac.  Her recent labs demonstrated a normal, his metabolic panel that her lipid profile demonstrated LDL slightly elevated at 127 for which she was started on a low cholesterol diet along with regular exercise. Her CBC was normal as well as her blood sugar and TSH. Her hepatitis C screen was negative her urinalysis was negative as was her mammogram.  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 Since then her Pap smears have been normal. Patient had a Mirena IUD placed in 2012 whose done well and is having no menstrual cycles.  Assessment/plan: #1 uterine fibroids stable #2 left ovarian cyst a follow up with ultrasound in 3 months. We'll not do a CA 125 because of its limitation and the presence of fibroids. #3 Pap smear with atypical squamous cells of undetermined significance with negative HPV Lungs discussed we'll followup with Pap smear and co- testing in one year if still present but followup with colposcopy.  Literature information was provided on the above topics.

## 2013-10-12 ENCOUNTER — Encounter: Payer: Self-pay | Admitting: Women's Health

## 2013-10-12 ENCOUNTER — Ambulatory Visit (INDEPENDENT_AMBULATORY_CARE_PROVIDER_SITE_OTHER): Payer: BC Managed Care – PPO | Admitting: Women's Health

## 2013-10-12 DIAGNOSIS — A499 Bacterial infection, unspecified: Secondary | ICD-10-CM

## 2013-10-12 DIAGNOSIS — B3731 Acute candidiasis of vulva and vagina: Secondary | ICD-10-CM

## 2013-10-12 DIAGNOSIS — B9689 Other specified bacterial agents as the cause of diseases classified elsewhere: Secondary | ICD-10-CM

## 2013-10-12 DIAGNOSIS — B373 Candidiasis of vulva and vagina: Secondary | ICD-10-CM

## 2013-10-12 DIAGNOSIS — N760 Acute vaginitis: Secondary | ICD-10-CM

## 2013-10-12 MED ORDER — METRONIDAZOLE 0.75 % VA GEL: VAGINAL | Status: DC

## 2013-10-12 MED ORDER — FLUCONAZOLE 150 MG PO TABS: ORAL_TABLET | ORAL | Status: DC

## 2013-10-12 NOTE — Addendum Note (Signed)
Addended by: Thamas Jaegers on: 10/12/2013 04:11 PM   Modules accepted: Orders

## 2013-10-12 NOTE — Patient Instructions (Signed)
Bacterial Vaginosis Bacterial vaginosis is an infection of the vagina. It happens when too many of certain germs (bacteria) grow in the vagina. HOME CARE  Take your medicine as told by your doctor.  Finish your medicine even if you start to feel better.  Do not have sex until you finish your medicine and are better.  Tell your sex partner that you have an infection. They should see their doctor for treatment.  Practice safe sex. Use condoms. Have only one sex partner. GET HELP IF:  You are not getting better after 3 days of treatment.  You have more grey fluid (discharge) coming from your vagina than before.  You have more pain than before.  You have a fever. MAKE SURE YOU:   Understand these instructions.  Will watch your condition.  Will get help right away if you are not doing well or get worse. Document Released: 01/28/2008 Document Revised: 02/08/2013 Document Reviewed: 11/30/2012  [facility] Patient Information 2014 [place]. Monilial Vaginitis Vaginitis in a soreness, swelling and redness (inflammation) of the vagina and vulva. Monilial vaginitis is not a sexually transmitted infection. CAUSES  Yeast vaginitis is caused by yeast (candida) that is normally found in your vagina. With a yeast infection, the candida has overgrown in number to a point that upsets the chemical balance. SYMPTOMS   White, thick vaginal discharge.  Swelling, itching, redness and irritation of the vagina and possibly the lips of the vagina (vulva).  Burning or painful urination.  Painful intercourse. DIAGNOSIS  Things that may contribute to monilial vaginitis are:  Postmenopausal and virginal states.  Pregnancy.  Infections.  Being tired, sick or stressed, especially if you had monilial vaginitis in the past.  Diabetes. Good control will help lower the chance.  Birth control pills.  Tight fitting garments.  Using bubble bath, feminine sprays, douches or deodorant  tampons.  Taking certain medications that kill germs (antibiotics).  Sporadic recurrence can occur if you become ill. TREATMENT  Your caregiver will give you medication.  There are several kinds of anti monilial vaginal creams and suppositories specific for monilial vaginitis. For recurrent yeast infections, use a suppository or cream in the vagina 2 times a week, or as directed.  Anti-monilial or steroid cream for the itching or irritation of the vulva may also be used. Get your caregiver's permission.  Painting the vagina with methylene blue solution may help if the monilial cream does not work.  Eating yogurt may help prevent monilial vaginitis. HOME CARE INSTRUCTIONS   Finish all medication as prescribed.  Do not have sex until treatment is completed or after your caregiver tells you it is okay.  Take warm sitz baths.  Do not douche.  Do not use tampons, especially scented ones.  Wear cotton underwear.  Avoid tight pants and panty hose.  Tell your sexual partner that you have a yeast infection. They should go to their caregiver if they have symptoms such as mild rash or itching.  Your sexual partner should be treated as well if your infection is difficult to eliminate.  Practice safer sex. Use condoms.  Some vaginal medications cause latex condoms to fail. Vaginal medications that harm condoms are:  Cleocin cream.  Butoconazole (Femstat).  Terconazole (Terazol) vaginal suppository.  Miconazole (Monistat) (may be purchased over the counter). SEEK MEDICAL CARE IF:   You have a temperature by mouth above 102 F (38.9 C).  The infection is getting worse after 2 days of treatment.  The infection is not getting better  after 3 days of treatment.  You develop blisters in or around your vagina.  You develop vaginal bleeding, and it is not your menstrual period.  You have pain when you urinate.  You develop intestinal problems.  You have pain with sexual  intercourse. Document Released: 01/28/2005 Document Revised: 07/13/2011 Document Reviewed: 10/12/2008 [facility] Patient Information 2014 [place], [place].

## 2013-10-12 NOTE — Progress Notes (Signed)
Patient ID: CHABELY NORBY, female   DOB: 01/08/1966, 48 y.o.   MRN: [id number]  Presents with complaint of vaginal pruritus x 2 days. Amenorrheic/Mirena IUD. Same partner. Notes thick white discharge. Recently here for U/S 10/05/13- fibroids with an ovarian cyst noted has schedule followup in 3 months.  Exam: appears well External: erythema at introitus Speculum: vaginal walls erythematous; thick white discharge present. Wet Prep: moderate yeast, positive amine, moderate clue cells, TNTC WBC, TNTC bacteria  Bacterial Vaginosis Yeast Vaginitis  Plan: Diflucan 150mg  tablet today, repeat in 3 days. Metrogel 0.7% intravaginal qhs x 5. Alcohol avoidance discussed. Instructed to call if symptoms worsen or do not improve. Yeast prevention reviewed.

## 2013-10-13 LAB — WET PREP FOR TRICH, YEAST, CLUE: Trich, Wet Prep: NONE SEEN

## 2013-10-19 ENCOUNTER — Other Ambulatory Visit: Payer: Self-pay | Admitting: Women's Health

## 2013-11-22 ENCOUNTER — Ambulatory Visit (INDEPENDENT_AMBULATORY_CARE_PROVIDER_SITE_OTHER): Payer: BC Managed Care – PPO | Admitting: Women's Health

## 2013-11-22 ENCOUNTER — Encounter: Payer: Self-pay | Admitting: Women's Health

## 2013-11-22 DIAGNOSIS — B3731 Acute candidiasis of vulva and vagina: Secondary | ICD-10-CM

## 2013-11-22 DIAGNOSIS — Z113 Encounter for screening for infections with a predominantly sexual mode of transmission: Secondary | ICD-10-CM

## 2013-11-22 DIAGNOSIS — B373 Candidiasis of vulva and vagina: Secondary | ICD-10-CM

## 2013-11-22 LAB — WET PREP FOR TRICH, YEAST, CLUE
Clue Cells Wet Prep HPF POC: NONE SEEN
TRICH WET PREP: NONE SEEN

## 2013-11-22 MED ORDER — TERCONAZOLE 0.4 % VA CREA: TOPICAL_CREAM | VAGINAL | Status: DC

## 2013-11-22 NOTE — Progress Notes (Signed)
Patient ID: Sharon Conrad, female   DOB: January 17, 1966, 48 y.o.   MRN: [id number] Presents with complaint of vaginal discharge with intense itching and discomfort. Was treated for yeast with Diflucan 1 month ago, states it helped initially but symptoms returned. Amenorrheic with Mirena IUD. New partner. Denies UTI symptoms, abdominal pain or fever.  Exam: Appears well. External genitalia extremely erythematous, speculum exam moderate amount of a white discharge noted, vaginal wall slightly erythematous, GC/Chlamydia culture taken. Wet prep positive for yeast. IUD strings visible. Bimanual no CMT or adnexal fullness or tenderness, uterus large 16-18 week size uterus, history of fibroids.  Yeast vaginitis STD screen Fibroids  Name: Terazol 7 one applicator at bedtime x7 and 1 applicator weekly if needed for itching. Instructed to call if no relief of symptoms. Encouraged condoms until permanent partner. Declines need for HIV hepatitis or RPR.

## 2013-11-23 LAB — GC/CHLAMYDIA PROBE AMP
CT Probe RNA: NEGATIVE
GC Probe RNA: NEGATIVE

## 2014-01-05 ENCOUNTER — Ambulatory Visit (INDEPENDENT_AMBULATORY_CARE_PROVIDER_SITE_OTHER): Payer: BC Managed Care – PPO | Admitting: Gynecology

## 2014-01-05 ENCOUNTER — Encounter: Payer: Self-pay | Admitting: Gynecology

## 2014-01-05 ENCOUNTER — Ambulatory Visit (INDEPENDENT_AMBULATORY_CARE_PROVIDER_SITE_OTHER): Payer: BC Managed Care – PPO

## 2014-01-05 DIAGNOSIS — T8332XS Displacement of intrauterine contraceptive device, sequela: Secondary | ICD-10-CM

## 2014-01-05 DIAGNOSIS — T889XXS Complication of surgical and medical care, unspecified, sequela: Secondary | ICD-10-CM

## 2014-01-05 DIAGNOSIS — N83209 Unspecified ovarian cyst, unspecified side: Secondary | ICD-10-CM

## 2014-01-05 DIAGNOSIS — N951 Menopausal and female climacteric states: Secondary | ICD-10-CM

## 2014-01-05 DIAGNOSIS — N83202 Unspecified ovarian cyst, left side: Secondary | ICD-10-CM

## 2014-01-05 DIAGNOSIS — D259 Leiomyoma of uterus, unspecified: Secondary | ICD-10-CM

## 2014-01-05 DIAGNOSIS — IMO0002 Reserved for concepts with insufficient information to code with codable children: Secondary | ICD-10-CM

## 2014-01-05 DIAGNOSIS — T8389XS Other specified complication of genitourinary prosthetic devices, implants and grafts, sequela: Secondary | ICD-10-CM

## 2014-01-05 DIAGNOSIS — R6889 Other general symptoms and signs: Secondary | ICD-10-CM

## 2014-01-05 NOTE — Progress Notes (Signed)
   Patient is a 48 year old who was instructed to return to the office today for followup ultrasound on fibroid uterus as well as position of her IUD. Patient was seen in the office on 10/05/2013. Her most recent ultrasound had demonstrated then the following:   Uterus measuring 10.2 x 8.5 x 6.1 cm with endometrial stripe of 2.7 mm. Patient with 8 intramural fibroids the largest one measuring 59 x 50 mm. In comparison with last year fibroid uterus has remained essentially stable. Right ovary was normal but a left sidewall echo-free cyst measuring 39 x 43 x 21 mm was noted an echogenic focus of 5 mm was noted the wall of the cyst and vascular no fluid in the cul-de-sac.  Due to the fact the patient had a fibroid uterus a CA 125 was not done. She has otherwise been asymptomatic. Patient also with history of ascus on recent Pap smear negative HPV screen.  Ultrasound today: Uterus measured 10.2 x 8.7 x 5.6 cm with endometrial stripe of 4.2 mm. Once again for uterine fibroids were noted the largest one measuring 59 x 47 mm unchanged. Right ovary normal small follicle measuring 20 x 19 mm. Left ovary was normal. Previous cyst no longer present just a small 25 x 20 mm follicle. IUD was seen in the right position.  Assessment/plan: Complete resolution of previously seen left ovarian cyst. Only bilateral small follicles were noted on both ovaries. Patient doing well otherwise. We discussed today her recent Pap smear of ASCUS negative HPV and the recommendation for followup Pap smear in one year according to the new guidelines. Patient with strong family history of colon cancer and now her being 48 years of age she was reminded once again to schedule her colonoscopy.

## 2014-01-29 ENCOUNTER — Other Ambulatory Visit: Payer: Self-pay | Admitting: Women's Health

## 2014-01-29 NOTE — Telephone Encounter (Signed)
Ok to refill, but if no relief OV

## 2014-08-15 ENCOUNTER — Telehealth: Payer: Self-pay | Admitting: *Deleted

## 2014-08-15 NOTE — Telephone Encounter (Signed)
Pt has annual scheduled on 10/07/13 has history of fibroid uterus, last ultrasound done in 01/2014. Pt asked if you want one scheduled this year? Please advise

## 2014-08-15 NOTE — Telephone Encounter (Signed)
Will relay to [employer]

## 2014-08-15 NOTE — Telephone Encounter (Signed)
No it has been less than a year

## 2014-09-30 ENCOUNTER — Other Ambulatory Visit: Payer: Self-pay | Admitting: Gynecology

## 2014-10-02 ENCOUNTER — Other Ambulatory Visit: Payer: Self-pay

## 2014-10-02 NOTE — Telephone Encounter (Signed)
Overdue for annual exam.

## 2014-10-02 NOTE — Telephone Encounter (Signed)
RGCE scheduled with Dr. Moshe Salisbury for 10/08/14.

## 2014-10-08 ENCOUNTER — Ambulatory Visit (INDEPENDENT_AMBULATORY_CARE_PROVIDER_SITE_OTHER): Payer: 59 | Admitting: Gynecology

## 2014-10-08 ENCOUNTER — Other Ambulatory Visit (HOSPITAL_COMMUNITY)
Admission: RE | Admit: 2014-10-08 | Discharge: 2014-10-08 | Disposition: A | Discharge status: Home / Self Care | Payer: 59 | Source: Ambulatory Visit | Attending: Gynecology | Admitting: Gynecology

## 2014-10-08 ENCOUNTER — Encounter: Payer: Self-pay | Admitting: Gynecology

## 2014-10-08 VITALS — BP 116/70 | Ht 64.5 in | Wt 145.4 lb

## 2014-10-08 DIAGNOSIS — E785 Hyperlipidemia, unspecified: Secondary | ICD-10-CM | POA: Diagnosis not present

## 2014-10-08 DIAGNOSIS — Z8639 Personal history of other endocrine, nutritional and metabolic disease: Secondary | ICD-10-CM

## 2014-10-08 DIAGNOSIS — R896 Abnormal cytological findings in specimens from other organs, systems and tissues: Secondary | ICD-10-CM | POA: Diagnosis not present

## 2014-10-08 DIAGNOSIS — Z124 Encounter for screening for malignant neoplasm of cervix: Secondary | ICD-10-CM | POA: Diagnosis not present

## 2014-10-08 DIAGNOSIS — D251 Intramural leiomyoma of uterus: Secondary | ICD-10-CM | POA: Diagnosis not present

## 2014-10-08 DIAGNOSIS — Z01419 Encounter for gynecological examination (general) (routine) without abnormal findings: Secondary | ICD-10-CM

## 2014-10-08 DIAGNOSIS — Z01411 Encounter for gynecological examination (general) (routine) with abnormal findings: Secondary | ICD-10-CM | POA: Insufficient documentation

## 2014-10-08 DIAGNOSIS — N951 Menopausal and female climacteric states: Secondary | ICD-10-CM

## 2014-10-08 DIAGNOSIS — Z1151 Encounter for screening for human papillomavirus (HPV): Secondary | ICD-10-CM | POA: Insufficient documentation

## 2014-10-08 DIAGNOSIS — L708 Other acne: Secondary | ICD-10-CM | POA: Diagnosis not present

## 2014-10-08 DIAGNOSIS — IMO0002 Reserved for concepts with insufficient information to code with codable children: Secondary | ICD-10-CM

## 2014-10-08 LAB — COMPREHENSIVE METABOLIC PANEL
ALK PHOS: 45 U/L (ref 39–117)
ALT: 12 U/L (ref 0–35)
AST: 17 U/L (ref 0–37)
Albumin: 4.2 g/dL (ref 3.5–5.2)
BILIRUBIN TOTAL: 0.3 mg/dL (ref 0.2–1.2)
BUN: 10 mg/dL (ref 6–23)
CHLORIDE: 104 meq/L (ref 96–112)
CO2: 25 meq/L (ref 19–32)
Calcium: 9.1 mg/dL (ref 8.4–10.5)
Creat: 0.69 mg/dL (ref 0.50–1.10)
GLUCOSE: 78 mg/dL (ref 70–99)
Potassium: 3.8 mEq/L (ref 3.5–5.3)
Sodium: 138 mEq/L (ref 135–145)
Total Protein: 7 g/dL (ref 6.0–8.3)

## 2014-10-08 LAB — LIPID PANEL
CHOL/HDL RATIO: 2.9 ratio
Cholesterol: 180 mg/dL (ref 0–200)
HDL: 62 mg/dL (ref >=46)
LDL CALC: 108 mg/dL — AB (ref 0–99)
Triglycerides: 49 mg/dL (ref <150)
VLDL: 10 mg/dL (ref 0–40)

## 2014-10-08 LAB — TSH: TSH: 1.453 u[IU]/mL (ref 0.350–4.500)

## 2014-10-08 MED ORDER — SPIRONOLACTONE 50 MG PO TABS: 50.0000 mg | ORAL_TABLET | Freq: Every day | ORAL | Status: DC

## 2014-10-08 MED ORDER — ALPRAZOLAM 0.25 MG PO TABS: 0.2500 mg | ORAL_TABLET | Freq: Every evening | ORAL | Status: DC | PRN

## 2014-10-08 MED ORDER — CITALOPRAM HYDROBROMIDE 10 MG PO TABS: 10.0000 mg | ORAL_TABLET | Freq: Every day | ORAL | Status: DC

## 2014-10-08 NOTE — Progress Notes (Signed)
Sharon Conrad 1966-03-22 [id number]   History:    49 y.o.  for annual gyn exam with no major complaints today. Patient with known history of leiomyomatous uteri. Patient with Mirena IUD placed several years ago. Vision with past history of hyperlipidemia on no treatment medication right now. Patient has done well on Aldactone 50 mg daily for acne. In 2015 her Pap smear demonstrated ASCUS but negative for HPV.  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 Patient had a Mirena IUD placed in 2012. Patient having some sporadic vasomotor symptoms at times. She does suffer from depression and has been tapering off her Celexa whereby she is breaking half of the 20 mg tablet. She does suffer from anxiety at times for which she takes Xanax 0.25 mg daily. Patient's currently undergoing a divorce.  Review of her record indicated last year her total cholesterol was elevated at 204 and her LDL was elevated at 127. Patient still has not had a colonoscopy.  Patient with past history of fibroid uterus had ultrasound September 2015 demonstrated the following: Uterus measured 10.2 x 8.7 x 5.6 cm with endometrial stripe of 4.2 mm. Once again for uterine fibroids were noted the largest one measuring 59 x 47 mm unchanged. Right ovary normal small follicle measuring 20 x 19 mm. Left ovary was normal. Previous cyst no longer present just a small 25 x 20 mm follicle. IUD was seen in the right position.   Past medical history,surgical history, family history and social history were all reviewed and documented in the EPIC chart.  Gynecologic History No LMP recorded. Patient is not currently having periods (Reason: IUD). Contraception: IUD Last Pap: 2015. Results were: See above Last mammogram: 2015. Results were: normal  Obstetric History OB History  Gravida Para Term Preterm AB SAB TAB Ectopic Multiple Living  0                  ROS: A ROS was performed and pertinent  positives and negatives are included in the history.  GENERAL: No fevers or chills. HEENT: No change in vision, no earache, sore throat or sinus congestion. NECK: No pain or stiffness. CARDIOVASCULAR: No chest pain or pressure. No palpitations. PULMONARY: No shortness of breath, cough or wheeze. GASTROINTESTINAL: No abdominal pain, nausea, vomiting or diarrhea, melena or bright red blood per rectum. GENITOURINARY: No urinary frequency, urgency, hesitancy or dysuria. MUSCULOSKELETAL: No joint or muscle pain, no back pain, no recent trauma. DERMATOLOGIC: No rash, no itching, no lesions. ENDOCRINE: No polyuria, polydipsia, no heat or cold intolerance. No recent change in weight. HEMATOLOGICAL: No anemia or easy bruising or bleeding. NEUROLOGIC: No headache, seizures, numbness, tingling or weakness. PSYCHIATRIC: No depression, no loss of interest in normal activity or change in sleep pattern.     Exam: chaperone present  BP 116/70 mmHg  Ht 5' 4.5" (1.638 m)  Wt 145 lb 6.4 oz (65.953 kg)  BMI 24.58 kg/m2  Body mass index is 24.58 kg/(m^2).  General appearance : Well developed well nourished female. No acute distress HEENT: Eyes: no retinal hemorrhage or exudates,  Neck supple, trachea midline, no carotid bruits, no thyroidmegaly Lungs: Clear to auscultation, no rhonchi or wheezes, or rib retractions  Heart: Regular rate and rhythm, no murmurs or gallops Breast:Examined in sitting and supine position were symmetrical in appearance, no palpable masses or tenderness,  no skin retraction, no nipple inversion, no nipple discharge, no skin discoloration, no axillary  or supraclavicular lymphadenopathy Abdomen: no palpable masses or tenderness, no rebound or guarding Extremities: no edema or skin discoloration or tenderness  Pelvic:  Bartholin, Urethra, Skene Glands: Within normal limits             Vagina: No gross lesions or discharge  Cervix: No gross lesions or discharge, IUD string not seen  Uterus   anteverted, normal size, shape and consistency, non-tender and mobile  Adnexa  Without masses or tenderness  Anus and perineum  normal   Rectovaginal  normal sphincter tone without palpated masses or tenderness             Hemoccult cards provided     Assessment/Plan:  49 y.o. female for annual exam was reminded to schedule her colonoscopy. The following fasting lab work were ordered: Comprehensive metabolic panel, fasting lipid profile, TSH, CBC, and urinalysis. Pap smear with HPV screening was done today. We discussed importance of calcium vitamin D and regular exercise for osteoporosis prevention. Patient in September will return back for an ultrasound to follow-up with the ultrasound of last year because of her history of fibroid uterus and the IUD string not seen today. Patient currently not sexually active.   Terrance Mass MD, 11:36 AM 10/08/2014

## 2014-10-08 NOTE — Addendum Note (Signed)
Addended by: Alen Blew on: 10/08/2014 03:23 PM   Modules accepted: Orders

## 2014-10-08 NOTE — Patient Instructions (Signed)
Hormone Therapy At menopause, your body begins making less estrogen and progesterone hormones. This causes the body to stop having menstrual periods. This is because estrogen and progesterone hormones control your periods and menstrual cycle. A lack of estrogen may cause symptoms such as:  Hot flushes (or hot flashes).  Vaginal dryness.  Dry skin.  Loss of sex drive.  Risk of bone loss (osteoporosis). When this happens, you may choose to take hormone therapy to get back the estrogen lost during menopause. When the hormone estrogen is given alone, it is usually referred to as ET (Estrogen Therapy). When the hormone progestin is combined with estrogen, it is generally called HT (Hormone Therapy). This was formerly known as hormone replacement therapy (HRT). Your caregiver can help you make a decision on what will be best for you. The decision to use HT seems to change often as new studies are done. Many studies do not agree on the benefits of hormone replacement therapy. LIKELY BENEFITS OF HT INCLUDE PROTECTION FROM:  Hot Flushes (also called hot flashes) - A hot flush is a sudden feeling of heat that spreads over the face and body. The skin may redden like a blush. It is connected with sweats and sleep disturbance. Women going through menopause may have hot flushes a few times a month or several times per day depending on the woman.  Osteoporosis (bone loss)- Estrogen helps guard against bone loss. After menopause, a woman's bones slowly lose calcium and become weak and brittle. As a result, bones are more likely to break. The hip, wrist, and spine are affected most often. Hormone therapy can help slow bone loss after menopause. Weight bearing exercise and taking calcium with vitamin D also can help prevent bone loss. There are also medications that your caregiver can prescribe that can help prevent osteoporosis.  Vaginal Dryness - Loss of estrogen causes changes in the vagina. Its lining may  become thin and dry. These changes can cause pain and bleeding during sexual intercourse. Dryness can also lead to infections. This can cause burning and itching. (Vaginal estrogen treatment can help relieve pain, itching, and dryness.)  Urinary Tract Infections are more common after menopause because of lack of estrogen. Some women also develop urinary incontinence because of low estrogen levels in the vagina and bladder.  Possible other benefits of estrogen include a positive effect on mood and short-term memory in women. RISKS AND COMPLICATIONS  Using estrogen alone without progesterone causes the lining of the uterus to grow. This increases the risk of lining of the uterus (endometrial) cancer. Your caregiver should give another hormone called progestin if you have a uterus.  Women who take combined (estrogen and progestin) HT appear to have an increased risk of breast cancer. The risk appears to be small, but increases throughout the time that HT is taken.  Combined therapy also makes the breast tissue slightly denser which makes it harder to read mammograms (breast X-rays).  Combined, estrogen and progesterone therapy can be taken together every day, in which case there may be spotting of blood. HT therapy can be taken cyclically in which case you will have menstrual periods. Cyclically means HT is taken for a set amount of days, then not taken, then this process is repeated.  HT may increase the risk of stroke, heart attack, breast cancer and forming blood clots in your leg.  Transdermal estrogen (estrogen that is absorbed through the skin with a patch or a cream) may have more positive results with:    Cholesterol.  Blood pressure.  Blood clots. Having the following conditions may indicate you should not have HT:  Endometrial cancer.  Liver disease.  Breast cancer.  Heart disease.  History of blood clots.  Stroke. TREATMENT   If you choose to take HT and have a uterus,  usually estrogen and progestin are prescribed.  Your caregiver will help you decide the best way to take the medications.  Possible ways to take estrogen include:  Pills.  Patches.  Gels.  Sprays.  Vaginal estrogen cream, rings and tablets.  It is best to take the lowest dose possible that will help your symptoms and take them for the shortest period of time that you can.  Hormone therapy can help relieve some of the problems (symptoms) that affect women at menopause. Before making a decision about HT, talk to your caregiver about what is best for you. Be well informed and comfortable with your decisions. HOME CARE INSTRUCTIONS   Follow your caregivers advice when taking the medications.  A Pap test is done to screen for cervical cancer.  The first Pap test should be done at age 21.  Between ages 21 and 29, Pap tests are repeated every 2 years.  Beginning at age 30, you are advised to have a Pap test every 3 years as long as your past 3 Pap tests have been normal.  Some women have medical problems that increase the chance of getting cervical cancer. Talk to your caregiver about these problems. It is especially important to talk to your caregiver if a new problem develops soon after your last Pap test. In these cases, your caregiver may recommend more frequent screening and Pap tests.  The above recommendations are the same for women who have or have not gotten the vaccine for HPV (Human Papillomavirus).  If you had a hysterectomy for a problem that was not a cancer or a condition that could lead to cancer, then you no longer need Pap tests. However, even if you no longer need a Pap test, a regular exam is a good idea to make sure no other problems are starting.   If you are between ages 65 and 70, and you have had normal Pap tests going back 10 years, you no longer need Pap tests. However, even if you no longer need a Pap test, a regular exam is a good idea to make sure no  other problems are starting.   If you have had past treatment for cervical cancer or a condition that could lead to cancer, you need Pap tests and screening for cancer for at least 20 years after your treatment.  If Pap tests have been discontinued, risk factors (such as a new sexual partner) need to be re-assessed to determine if screening should be resumed.  Some women may need screenings more often if they are at high risk for cervical cancer.  Get mammograms done as per the advice of your caregiver. SEEK IMMEDIATE MEDICAL CARE IF:  You develop abnormal vaginal bleeding.  You have pain or swelling in your legs, shortness of breath, or chest pain.  You develop dizziness or headaches.  You have lumps or changes in your breasts or armpits.  You have slurred speech.  You develop weakness or numbness of your arms or legs.  You have pain, burning, or bleeding when urinating.  You develop abdominal pain. Document Released: 01/17/2003 Document Revised: 07/13/2011 Document Reviewed: 05/07/2010 [employer] Patient Information 2015 [employer]. This information is not intended to   replace advice given to you by your health care provider. Make sure you discuss any questions you have with your health care provider. Perimenopause Perimenopause is the time when your body begins to move into the menopause (no menstrual period for 12 straight months). It is a natural process. Perimenopause can begin 2-8 years before the menopause and usually lasts for 1 year after the menopause. During this time, your ovaries may or may not produce an egg. The ovaries vary in their production of estrogen and progesterone hormones each month. This can cause irregular menstrual periods, difficulty getting pregnant, vaginal bleeding between periods, and uncomfortable symptoms. CAUSES  Irregular production of the ovarian hormones, estrogen and progesterone, and not ovulating every month.  Other causes  include:  Tumor of the pituitary gland in the brain.  Medical disease that affects the ovaries.  Radiation treatment.  Chemotherapy.  Unknown causes.  Heavy smoking and excessive alcohol intake can bring on perimenopause sooner. SIGNS AND SYMPTOMS   Hot flashes.  Night sweats.  Irregular menstrual periods.  Decreased sex drive.  Vaginal dryness.  Headaches.  Mood swings.  Depression.  Memory problems.  Irritability.  Tiredness.  Weight gain.  Trouble getting pregnant.  The beginning of losing bone cells (osteoporosis).  The beginning of hardening of the arteries (atherosclerosis). DIAGNOSIS  Your health care provider will make a diagnosis by analyzing your age, menstrual history, and symptoms. He or she will do a physical exam and note any changes in your body, especially your female organs. Female hormone tests may or may not be helpful depending on the amount of female hormones you produce and when you produce them. However, other hormone tests may be helpful to rule out other problems. TREATMENT  In some cases, no treatment is needed. The decision on whether treatment is necessary during the perimenopause should be made by you and your health care provider based on how the symptoms are affecting you and your lifestyle. Various treatments are available, such as:  Treating individual symptoms with a specific medicine for that symptom.  Herbal medicines that can help specific symptoms.  Counseling.  Group therapy. HOME CARE INSTRUCTIONS   Keep track of your menstrual periods (when they occur, how heavy they are, how long between periods, and how long they last) as well as your symptoms and when they started.  Only take over-the-counter or prescription medicines as directed by your health care provider.  Sleep and rest.  Exercise.  Eat a diet that contains calcium (good for your bones) and soy (acts like the estrogen hormone).  Do not smoke.  Avoid  alcoholic beverages.  Take vitamin supplements as recommended by your health care provider. Taking vitamin E may help in certain cases.  Take calcium and vitamin D supplements to help prevent bone loss.  Group therapy is sometimes helpful.  Acupuncture may help in some cases. SEEK MEDICAL CARE IF:   You have questions about any symptoms you are having.  You need a referral to a specialist (gynecologist, psychiatrist, or psychologist). SEEK IMMEDIATE MEDICAL CARE IF:   You have vaginal bleeding.  Your period lasts longer than 8 days.  Your periods are recurring sooner than 21 days.  You have bleeding after intercourse.  You have severe depression.  You have pain when you urinate.  You have severe headaches.  You have vision problems. Document Released: 05/28/2004 Document Revised: 02/08/2013 Document Reviewed: 11/17/2012 [facility] Patient Information 2015 [place], [place]. This information is not intended to replace advice given to  to you by your health care provider. Make sure you discuss any questions you have with your health care provider.  

## 2014-10-09 ENCOUNTER — Encounter: Payer: Self-pay | Admitting: Gynecology

## 2014-10-09 ENCOUNTER — Other Ambulatory Visit: Payer: Self-pay | Admitting: *Deleted

## 2014-10-09 DIAGNOSIS — E559 Vitamin D deficiency, unspecified: Secondary | ICD-10-CM

## 2014-10-09 LAB — CBC WITH DIFFERENTIAL/PLATELET
BASOS ABS: 0.1 10*3/uL (ref 0.0–0.1)
Basophils Relative: 1 % (ref 0–1)
EOS ABS: 0.1 10*3/uL (ref 0.0–0.7)
Eosinophils Relative: 2 % (ref 0–5)
HCT: 41.3 % (ref 36.0–46.0)
Hemoglobin: 13.8 g/dL (ref 12.0–15.0)
LYMPHS PCT: 30 % (ref 12–46)
Lymphs Abs: 1.7 10*3/uL (ref 0.7–4.0)
MCH: 29.9 pg (ref 26.0–34.0)
MCHC: 33.4 g/dL (ref 30.0–36.0)
MCV: 89.4 fL (ref 78.0–100.0)
MPV: 9.7 fL (ref 8.6–12.4)
Monocytes Absolute: 0.3 10*3/uL (ref 0.1–1.0)
Monocytes Relative: 6 % (ref 3–12)
NEUTROS PCT: 61 % (ref 43–77)
Neutro Abs: 3.4 10*3/uL (ref 1.7–7.7)
Platelets: 184 10*3/uL (ref 150–400)
RBC: 4.62 MIL/uL (ref 3.87–5.11)
RDW: 14.7 % (ref 11.5–15.5)
WBC: 5.6 10*3/uL (ref 4.0–10.5)

## 2014-10-09 LAB — URINALYSIS W MICROSCOPIC + REFLEX CULTURE
Bacteria, UA: NONE SEEN
Bilirubin Urine: NEGATIVE
Casts: NONE SEEN
Crystals: NONE SEEN
GLUCOSE, UA: NEGATIVE mg/dL
Hgb urine dipstick: NEGATIVE
Ketones, ur: NEGATIVE mg/dL
LEUKOCYTES UA: NEGATIVE
NITRITE: NEGATIVE
Protein, ur: NEGATIVE mg/dL
Specific Gravity, Urine: 1.007 (ref 1.005–1.030)
Urobilinogen, UA: 0.2 mg/dL (ref 0.0–1.0)
pH: 6 (ref 5.0–8.0)

## 2014-10-09 LAB — FOLLICLE STIMULATING HORMONE: FSH: 3.7 m[IU]/mL

## 2014-10-09 LAB — VITAMIN D 25 HYDROXY (VIT D DEFICIENCY, FRACTURES): VIT D 25 HYDROXY: 26 ng/mL — AB (ref 30–100)

## 2014-10-09 MED ORDER — VITAMIN D (ERGOCALCIFEROL) 1.25 MG (50000 UNIT) PO CAPS: 50000.0000 [IU] | ORAL_CAPSULE | ORAL | Status: DC

## 2014-10-10 LAB — CYTOLOGY - PAP

## 2014-10-16 ENCOUNTER — Encounter: Payer: Self-pay | Admitting: Gynecology

## 2015-01-14 ENCOUNTER — Other Ambulatory Visit: Payer: 59

## 2015-01-14 ENCOUNTER — Ambulatory Visit: Payer: 59 | Admitting: Gynecology

## 2015-01-21 ENCOUNTER — Other Ambulatory Visit: Payer: 59

## 2015-01-21 ENCOUNTER — Ambulatory Visit: Payer: 59 | Admitting: Gynecology

## 2015-02-04 ENCOUNTER — Telehealth: Payer: Self-pay | Admitting: *Deleted

## 2015-02-04 NOTE — Telephone Encounter (Signed)
Pt called requesting referral to GI for colonscopy, I spoke with patient and explained to patient that per her insurance plan pt will need referral from PCP, pt will contact [employer] and find out name and # of PCP.

## 2015-02-10 ENCOUNTER — Other Ambulatory Visit: Payer: Self-pay | Admitting: Gynecology

## 2015-02-11 ENCOUNTER — Other Ambulatory Visit: Payer: Self-pay | Admitting: Gynecology

## 2015-02-11 ENCOUNTER — Encounter: Payer: Self-pay | Admitting: Gastroenterology

## 2015-02-11 ENCOUNTER — Ambulatory Visit (INDEPENDENT_AMBULATORY_CARE_PROVIDER_SITE_OTHER): Payer: 59 | Admitting: Gynecology

## 2015-02-11 ENCOUNTER — Encounter: Payer: Self-pay | Admitting: Gynecology

## 2015-02-11 ENCOUNTER — Ambulatory Visit (INDEPENDENT_AMBULATORY_CARE_PROVIDER_SITE_OTHER): Payer: 59

## 2015-02-11 VITALS — BP 140/88

## 2015-02-11 DIAGNOSIS — Z8639 Personal history of other endocrine, nutritional and metabolic disease: Secondary | ICD-10-CM

## 2015-02-11 DIAGNOSIS — D251 Intramural leiomyoma of uterus: Secondary | ICD-10-CM

## 2015-02-11 DIAGNOSIS — D252 Subserosal leiomyoma of uterus: Secondary | ICD-10-CM

## 2015-02-11 NOTE — Progress Notes (Signed)
   Patient is a 49 year old who presented to the office today for follow-up ultrasound and for confirmation of the IUD is in the proper position since patient has past history of fibroid uterus. Patient otherwise been asymptomatic she was noted to have vitamin D deficiency and currently on 50,000 units every weekly for 12 weeks for which she is completing therapy and we'll be checking her vitamin D level today.  Her ultrasound in September 2015 demonstrated the following: Uterus measured 10.2 x 8.7 x 5.6 cm with endometrial stripe of 4.2 mm. Once again for uterine fibroids were noted the largest one measuring 59 x 47 mm unchanged. Right ovary normal small follicle measuring 20 x 19 mm. Left ovary was normal. Previous cyst no longer present just a small 25 x 20 mm follicle. IUD was seen in the right position  Ultrasound today: Uterus measured 9.7 x 10.0 x 5.8 cm within a meter stripe of 3.5 mm. Patient with multiple intramural and subserosal fibroids the largest one measuring 59 x 49 mm. IUD was seen in the proper position. Normal right and left ovary. No adnexal masses and no fluid in the cul-de-sac.  Assessment/plan: #1 vitamin D deficiency currently on therapy vitamin D level be checked today then patient will begin taking vitamin D3 2000 units daily #2 stable leiomyomatous uteri IUD in proper position will need to be changed next year

## 2015-02-12 LAB — VITAMIN D 25 HYDROXY (VIT D DEFICIENCY, FRACTURES): VIT D 25 HYDROXY: 38 ng/mL (ref 30–100)

## 2015-03-26 ENCOUNTER — Other Ambulatory Visit: Payer: Self-pay | Admitting: Gynecology

## 2015-04-01 ENCOUNTER — Ambulatory Visit: Payer: 59

## 2015-04-01 VITALS — Ht 64.0 in | Wt 146.6 lb

## 2015-04-01 DIAGNOSIS — Z1211 Encounter for screening for malignant neoplasm of colon: Secondary | ICD-10-CM

## 2015-04-01 NOTE — Progress Notes (Signed)
No allergies to eggs or soy No past problems with anesthesia No diet/weight loss meds No home oxygen  Has email and internet; registered for emmi

## 2015-04-15 ENCOUNTER — Encounter: Payer: Self-pay | Admitting: Gastroenterology

## 2015-04-15 ENCOUNTER — Ambulatory Visit (AMBULATORY_SURGERY_CENTER): Payer: 59 | Admitting: Gastroenterology

## 2015-04-15 VITALS — BP 136/78 | HR 68 | Temp 98.5°F | Resp 52 | Ht 64.5 in | Wt 145.0 lb

## 2015-04-15 DIAGNOSIS — Z1211 Encounter for screening for malignant neoplasm of colon: Secondary | ICD-10-CM | POA: Diagnosis not present

## 2015-04-15 MED ORDER — SODIUM CHLORIDE 0.9 % IV SOLN: 500.0000 mL | INTRAVENOUS | Status: DC

## 2015-04-15 NOTE — Patient Instructions (Signed)
YOU HAD AN ENDOSCOPIC PROCEDURE TODAY AT THE [facility]:   Refer to the procedure report that was given to you for any specific questions about what was found during the examination.  If the procedure report does not answer your questions, please call your gastroenterologist to clarify.  If you requested that your care partner not be given the details of your procedure findings, then the procedure report has been included in a sealed envelope for you to review at your convenience later.  YOU SHOULD EXPECT: Some feelings of bloating in the abdomen. Passage of more gas than usual.  Walking can help get rid of the air that was put into your GI tract during the procedure and reduce the bloating. If you had a lower endoscopy (such as a colonoscopy or flexible sigmoidoscopy) you may notice spotting of blood in your stool or on the toilet paper. If you underwent a bowel prep for your procedure, you may not have a normal bowel movement for a few days.  Please Note:  You might notice some irritation and congestion in your nose or some drainage.  This is from the oxygen used during your procedure.  There is no need for concern and it should clear up in a day or so.  SYMPTOMS TO REPORT IMMEDIATELY:   Following lower endoscopy (colonoscopy or flexible sigmoidoscopy):  Excessive amounts of blood in the stool  Significant tenderness or worsening of abdominal pains  Swelling of the abdomen that is new, acute  Fever of 100F or higher  For urgent or emergent issues, a gastroenterologist can be reached at any hour by calling [phone].   DIET: Your first meal following the procedure should be a small meal and then it is ok to progress to your normal diet. Heavy or fried foods are harder to digest and may make you feel nauseous or bloated.  Likewise, meals heavy in dairy and vegetables can increase bloating.  Drink plenty of fluids but you should avoid alcoholic beverages for 24  hours.  ACTIVITY:  You should plan to take it easy for the rest of today and you should NOT DRIVE or use heavy machinery until tomorrow (because of the sedation medicines used during the test).    FOLLOW UP: Our staff will call the number listed on your records the next business day following your procedure to check on you and address any questions or concerns that you may have regarding the information given to you following your procedure. If we do not reach you, we will leave a message.  However, if you are feeling well and you are not experiencing any problems, there is no need to return our call.  We will assume that you have returned to your regular daily activities without incident.  If any biopsies were taken you will be contacted by phone or by letter within the next 1-3 weeks.  Please call us at [phone] if you have not heard about the biopsies in 3 weeks.    SIGNATURES/CONFIDENTIALITY: You and/or your care partner have signed paperwork which will be entered into your electronic medical record.  These signatures attest to the fact that that the information above on your After Visit Summary has been reviewed and is understood.  Full responsibility of the confidentiality of this discharge information lies with you and/or your care-partner.  Next colonoscopy in 10 years. 

## 2015-04-15 NOTE — Progress Notes (Signed)
To recovery, report to Myers, RN, VSS. 

## 2015-04-15 NOTE — Op Note (Signed)
[facility]  [address] [place], [address]   COLONOSCOPY PROCEDURE REPORT  PATIENT: Sharon Conrad, Sharon Conrad  MR#: [id number] BIRTHDATE: 17-Jan-1966 , 49  yrs. old GENDER: female ENDOSCOPIST: Milus Banister, MD REFERRED BY: Otho Najjar, MD PROCEDURE DATE:  04/15/2015 PROCEDURE:   Colonoscopy, screening First Screening Colonoscopy - Avg.  risk and is 50 yrs.  old or older Yes.  Prior Negative Screening - Now for repeat screening. N/A  History of Adenoma - Now for follow-up colonoscopy & has been > or = to 3 yrs.  N/A  Recommend repeat exam, <10 yrs? No ASA CLASS:   Class II INDICATIONS:Screening for colonic neoplasia and Colorectal Neoplasm Risk Assessment for this procedure is average risk. MEDICATIONS: Monitored anesthesia care and Propofol 170 mg IV  DESCRIPTION OF PROCEDURE:   After the risks benefits and alternatives of the procedure were thoroughly explained, informed consent was obtained.  The digital rectal exam revealed no abnormalities of the rectum.   The [id number]  endoscope was introduced through the anus and advanced to the cecum, which was identified by both the appendix and ileocecal valve. No adverse events experienced.   The quality of the prep was excellent.  The instrument was then slowly withdrawn as the colon was fully examined. Estimated blood loss is zero unless otherwise noted in this procedure report.   COLON FINDINGS: A normal appearing cecum, ileocecal valve, and appendiceal orifice were identified.  The ascending, transverse, descending, sigmoid colon, and rectum appeared unremarkable. Retroflexed views revealed no abnormalities. The time to cecum = 2.7 Withdrawal time = 6.6   The scope was withdrawn and the procedure completed. COMPLICATIONS: There were no immediate complications.  ENDOSCOPIC IMPRESSION: Normal colonoscopy No polyps or cancer  RECOMMENDATIONS: You should continue to follow colorectal cancer  screening guidelines for "routine risk" patients with a repeat colonoscopy in 10 years.   eSigned:  Milus Banister, MD 04/15/2015 11:16 AM

## 2015-04-16 ENCOUNTER — Telehealth: Payer: Self-pay | Admitting: *Deleted

## 2015-04-16 NOTE — Telephone Encounter (Signed)
  Follow up Call-  Call back number 04/15/2015  Post procedure Call Back phone  # [phone]  Permission to leave phone message Yes     Patient questions:  Do you have a fever, pain , or abdominal swelling? No. Pain Score  0 *  Have you tolerated food without any problems? Yes.    Have you been able to return to your normal activities? Yes.    Do you have any questions about your discharge instructions: Diet   No. Medications  No. Follow up visit  No.  Do you have questions or concerns about your Care? No.  Actions: * If pain score is 4 or above: No action needed, pain <4.

## 2015-09-12 ENCOUNTER — Other Ambulatory Visit: Payer: Self-pay | Admitting: Gynecology

## 2015-09-12 NOTE — Telephone Encounter (Signed)
Needs annual exam in June

## 2015-09-24 ENCOUNTER — Telehealth: Payer: Self-pay | Admitting: Gynecology

## 2015-09-24 NOTE — Telephone Encounter (Signed)
09/24/15-Pt was informed today that her [facility] ins will cover the Mirena replacement for contraception at 100%, no copay per [email or web address] -Ref#[id number]

## 2015-10-11 ENCOUNTER — Telehealth: Payer: Self-pay

## 2015-10-11 MED ORDER — CITALOPRAM HYDROBROMIDE 10 MG PO TABS: 10.0000 mg | ORAL_TABLET | Freq: Every day | ORAL | Status: DC

## 2015-10-11 NOTE — Telephone Encounter (Signed)
CE is scheduled in June.

## 2015-10-21 ENCOUNTER — Ambulatory Visit (INDEPENDENT_AMBULATORY_CARE_PROVIDER_SITE_OTHER): Payer: BLUE CROSS/BLUE SHIELD | Admitting: Gynecology

## 2015-10-21 ENCOUNTER — Encounter: Payer: Self-pay | Admitting: Gynecology

## 2015-10-21 VITALS — BP 142/90 | Ht 65.0 in | Wt 142.0 lb

## 2015-10-21 DIAGNOSIS — D251 Intramural leiomyoma of uterus: Secondary | ICD-10-CM | POA: Diagnosis not present

## 2015-10-21 DIAGNOSIS — Z1231 Encounter for screening mammogram for malignant neoplasm of breast: Secondary | ICD-10-CM | POA: Diagnosis not present

## 2015-10-21 DIAGNOSIS — Z01419 Encounter for gynecological examination (general) (routine) without abnormal findings: Secondary | ICD-10-CM

## 2015-10-21 DIAGNOSIS — Z8639 Personal history of other endocrine, nutritional and metabolic disease: Secondary | ICD-10-CM

## 2015-10-21 LAB — CBC WITH DIFFERENTIAL/PLATELET
BASOS PCT: 0 %
Basophils Absolute: 0 cells/uL (ref 0–200)
EOS ABS: 216 {cells}/uL (ref 15–500)
Eosinophils Relative: 3 %
HCT: 41.2 % (ref 35.0–45.0)
Hemoglobin: 13.9 g/dL (ref 11.7–15.5)
Lymphocytes Relative: 24 %
Lymphs Abs: 1728 cells/uL (ref 850–3900)
MCH: 29.6 pg (ref 27.0–33.0)
MCHC: 33.7 g/dL (ref 32.0–36.0)
MCV: 87.7 fL (ref 80.0–100.0)
MONO ABS: 432 {cells}/uL (ref 200–950)
MONOS PCT: 6 %
MPV: 10 fL (ref 7.5–12.5)
NEUTROS ABS: 4824 {cells}/uL (ref 1500–7800)
Neutrophils Relative %: 67 %
PLATELETS: 183 10*3/uL (ref 140–400)
RBC: 4.7 MIL/uL (ref 3.80–5.10)
RDW: 14.2 % (ref 11.0–15.0)
WBC: 7.2 10*3/uL (ref 3.8–10.8)

## 2015-10-21 LAB — COMPREHENSIVE METABOLIC PANEL
ALK PHOS: 46 U/L (ref 33–130)
ALT: 10 U/L (ref 6–29)
AST: 16 U/L (ref 10–35)
Albumin: 4.3 g/dL (ref 3.6–5.1)
BILIRUBIN TOTAL: 0.4 mg/dL (ref 0.2–1.2)
BUN: 10 mg/dL (ref 7–25)
CO2: 24 mmol/L (ref 20–31)
CREATININE: 0.72 mg/dL (ref 0.50–1.05)
Calcium: 9 mg/dL (ref 8.6–10.4)
Chloride: 103 mmol/L (ref 98–110)
Glucose, Bld: 77 mg/dL (ref 65–99)
POTASSIUM: 3.9 mmol/L (ref 3.5–5.3)
SODIUM: 137 mmol/L (ref 135–146)
TOTAL PROTEIN: 6.8 g/dL (ref 6.1–8.1)

## 2015-10-21 LAB — TSH: TSH: 3.32 mIU/L

## 2015-10-21 LAB — LIPID PANEL
CHOLESTEROL: 194 mg/dL (ref 125–200)
HDL: 68 mg/dL (ref >=46)
LDL Cholesterol: 113 mg/dL (ref <130)
Total CHOL/HDL Ratio: 2.9 Ratio (ref <=5.0)
Triglycerides: 66 mg/dL (ref <150)
VLDL: 13 mg/dL (ref <30)

## 2015-10-21 NOTE — Progress Notes (Signed)
Sharon Conrad [birth year] RO:[id number]   History:    50 y.o.  for annual gyn exam with only complaint at times of low abdominal pelvic pressure. Patient with known history fibroid uterus.Patient with Mirena IUD placed several years ago. Vision with past history of hyperlipidemia on no treatment medication right now. Patient has done well on Aldactone 50 mg daily for acne. In 2015 her Pap smear demonstrated ASCUS but negative for HPV.  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 Patient had a Mirena IUD placed in 2012. Patient having some sporadic vasomotor symptoms at times. She does suffer from depression and has been tapering off her Celexa whereby she is breaking half of the 20 mg tablet. She does suffer from anxiety at times for which she takes Xanax 0.25 mg daily. Patient currently divorced.  Patient had an ultrasound September 2015 which demonstrated the following: Patient with past history of fibroid uterus had ultrasound September 2015 demonstrated the following: Uterus measured 10.2 x 8.7 x 5.6 cm with endometrial stripe of 4.2 mm. Once again for uterine fibroids were noted the largest one measuring 59 x 47 mm unchanged. Right ovary normal small follicle measuring 20 x 19 mm. Left ovary was normal. Previous cyst no longer present just a small 25 x 20 mm follicle. IUD was seen in the right position   Is Dr. Rockwell [place]  her PCP is Dr. Beather Arbour which she has not seen him in over a year  Past medical history,surgical history, family history and social history were all reviewed and documented in the EPIC chart.  Gynecologic History No LMP recorded. Patient is not currently having periods (Reason: IUD). Contraception: IUD Last Pap: 2016. Results were: normal Last mammogram: 2016. Results were: normal  Obstetric History OB History  Gravida Para Term Preterm AB SAB TAB Ectopic Multiple Living  0                  ROS: A ROS was performed and pertinent  positives and negatives are included in the history.  GENERAL: No fevers or chills. HEENT: No change in vision, no earache, sore throat or sinus congestion. NECK: No pain or stiffness. CARDIOVASCULAR: No chest pain or pressure. No palpitations. PULMONARY: No shortness of breath, cough or wheeze. GASTROINTESTINAL: No abdominal pain, nausea, vomiting or diarrhea, melena or bright red blood per rectum. GENITOURINARY: No urinary frequency, urgency, hesitancy or dysuria. MUSCULOSKELETAL: No joint or muscle pain, no back pain, no recent trauma. DERMATOLOGIC: No rash, no itching, no lesions. ENDOCRINE: No polyuria, polydipsia, no heat or cold intolerance. No recent change in weight. HEMATOLOGICAL: No anemia or easy bruising or bleeding. NEUROLOGIC: No headache, seizures, numbness, tingling or weakness. PSYCHIATRIC: No depression, no loss of interest in normal activity or change in sleep pattern.     Exam: chaperone present  BP 142/90 mmHg  Ht 5\' 5"  (1.651 m)  Wt 142 lb (64.411 kg)  BMI 23.63 kg/m2  Body mass index is 23.63 kg/(m^2).  General appearance : Well developed well nourished female. No acute distress HEENT: Eyes: no retinal hemorrhage or exudates,  Neck supple, trachea midline, no carotid bruits, no thyroidmegaly Lungs: Clear to auscultation, no rhonchi or wheezes, or rib retractions  Heart: Regular rate and rhythm, no murmurs or gallops Breast:Examined in sitting and supine position were symmetrical in appearance, no palpable masses or tenderness,  no skin retraction, no nipple inversion, no nipple discharge, no skin discoloration, no axillary or  supraclavicular lymphadenopathy Abdomen: no palpable masses or tenderness, no rebound or guarding Extremities: no edema or skin discoloration or tenderness  Pelvic:  Bartholin, Urethra, Skene Glands: Within normal limits             Vagina: No gross lesions or discharge  Cervix: No gross lesions or discharge  Uterus  14 week size and irregular,     Adnexa  Without masses or tenderness  Anus and perineum  normal   Rectovaginal  normal sphincter tone without palpated masses or tenderness             Hemoccult colonoscopy less than 12 months ago     Assessment/Plan:  50 y.o. female for annual exam with history of leiomyomatous uteri. Patient was scheduled to have her IUD changed at the end of this month. It appears that her uterus is gone bigger and felt 14 week size and she's having discomfort. She is going to return back later this week for an ultrasound. I have recommended because she is perimenopausal of these fibroids are growing that we should proceed with an abdominal hysterectomy because of concerns of malignant transformation such as leiomyosarcoma. She is fasting today in the following fasting blood work was ordered: CBC, comprehensive metabolic, fasting lipid profile, TSH, and urinalysis. Patient has had 5 years of normal Pap smears. No Pap smear done today we'll proceed with the new guidelines every 3 years. IUD string not visualized. Patient was reminded to schedule her mammogram.   Terrance Mass MD, 11:41 AM 10/21/2015

## 2015-10-21 NOTE — Patient Instructions (Signed)
Abdominal Hysterectomy Abdominal hysterectomy is a surgical procedure to remove your womb (uterus). Your uterus is the muscular organ that contains a developing baby. This surgery is done for many reasons. You may need an abdominal hysterectomy if you have cancer, growths (tumors), long-term pain, or bleeding. You may also have this procedure if your uterus has slipped down into your vagina (uterine prolapse). Depending on why you need an abdominal hysterectomy, you may also have other reproductive organs removed. These could include the part of your vagina that connects with your uterus (cervix), the organs that make eggs (ovaries), and the tubes that connect the ovaries to the uterus (fallopian tubes). LET YOUR HEALTH CARE PROVIDER KNOW ABOUT:   Any allergies you have.  All medicines you are taking, including vitamins, herbs, eye drops, creams, and over-the-counter medicines.  Previous problems you or members of your family have had with the use of anesthetics.  Any blood disorders you have.  Previous surgeries you have had.  Medical conditions you have. RISKS AND COMPLICATIONS Generally, this is a safe procedure. However, as with any procedure, problems can occur. Infection is the most common problem after an abdominal hysterectomy. Other possible problems include:  Bleeding.  Formation of blood clots that may break free and travel to your lungs.  Injury to other organs near your uterus.  Nerve injury causing nerve pain.  Decreased interest in sex or pain during sexual intercourse. BEFORE THE PROCEDURE  Abdominal hysterectomy is a major surgical procedure. It can affect the way you feel about yourself. Talk to your health care provider about the physical and emotional changes hysterectomy may cause.  You may need to have blood work and X-rays done before surgery.  Quit smoking if you smoke. Ask your health care provider for help if you are struggling to quit.  Stop taking  medicines that thin your blood as directed by your health care provider.  You may be instructed to take antibiotic medicines or laxatives before surgery.  Do not eat or drink anything for 6-8 hours before surgery.  Take your regular medicines with a small sip of water.  Bathe or shower the night or morning before surgery. PROCEDURE  Abdominal hysterectomy is done in the operating room at the hospital.  In most cases, you will be given a medicine that makes you go to sleep (general anesthetic).  The surgeon will make a cut (incision) through the skin in your lower belly.  The incision may be about 5-7 inches long. It may go side-to-side or up-and-down.  The surgeon will move aside the body tissue that covers your uterus. The surgeon will then carefully take out your uterus along with any of your other reproductive organs that need to be removed.  Bleeding will be controlled with clamps or sutures.  The surgeon will close your incision with sutures or metal clips. AFTER THE PROCEDURE  You will have some pain immediately after the procedure.  You will be given pain medicine in the recovery room.  You will be taken to your hospital room when you have recovered from the anesthesia.  You may need to stay in the hospital for 2-5 days.  You will be given instructions for recovery at home.   This information is not intended to replace advice given to you by your health care provider. Make sure you discuss any questions you have with your health care provider.   Document Released: 04/25/2013 Document Reviewed: 04/25/2013 Elsevier Interactive Patient Education 2016 Else[employer] Inc[employer] Uterine Fibroids   Uterine fibroids are tissue masses (tumors) that can develop in the womb (uterus). They are also called leiomyomas. This type of tumor is not cancerous (benign) and does not spread to other parts of the body outside of the pelvic area, which is between the hip bones. Occasionally, fibroids may  develop in the fallopian tubes, in the cervix, or on the support structures (ligaments) that surround the uterus. You can have one or many fibroids. Fibroids can vary in size, weight, and where they grow in the uterus. Some can become quite large. Most fibroids do not require medical treatment. CAUSES A fibroid can develop when a single uterine cell keeps growing (replicating). Most cells in the human body have a control mechanism that keeps them from replicating without control. SIGNS AND SYMPTOMS Symptoms may include:   Heavy bleeding during your period.  Bleeding or spotting between periods.  Pelvic pain and pressure.  Bladder problems, such as needing to urinate more often (urinary frequency) or urgently.  Inability to reproduce offspring (infertility).  Miscarriages. DIAGNOSIS Uterine fibroids are diagnosed through a physical exam. Your health care provider may feel the lumpy tumors during a pelvic exam. Ultrasonography and an MRI may be done to determine the size, location, and number of fibroids. TREATMENT Treatment may include:  Watchful waiting. This involves getting the fibroid checked by your health care provider to see if it grows or shrinks. Follow your health care provider's recommendations for how often to have this checked.  Hormone medicines. These can be taken by mouth or given through an intrauterine device (IUD).  Surgery.  Removing the fibroids (myomectomy) or the uterus (hysterectomy).  Removing blood supply to the fibroids (uterine artery embolization). If fibroids interfere with your fertility and you want to become pregnant, your health care provider may recommend having the fibroids removed.  HOME CARE INSTRUCTIONS  Keep all follow-up visits as directed by your health care provider. This is important.  Take medicines only as directed by your health care provider.  If you were prescribed a hormone treatment, take the hormone medicines exactly as  directed.  Do not take aspirin, because it can cause bleeding.  Ask your health care provider about taking iron pills and increasing the amount of dark green, leafy vegetables in your diet. These actions can help to boost your blood iron levels, which may be affected by heavy menstrual bleeding.  Pay close attention to your period and tell your health care provider about any changes, such as:  Increased blood flow that requires you to use more pads or tampons than usual per month.  A change in the number of days that your period lasts per month.  A change in symptoms that are associated with your period, such as abdominal cramping or back pain. SEEK MEDICAL CARE IF:  You have pelvic pain, back pain, or abdominal cramps that cannot be controlled with medicines.  You have an increase in bleeding between and during periods.  You soak tampons or pads in a half hour or less.  You feel lightheaded, extra tired, or weak. SEEK IMMEDIATE MEDICAL CARE IF:  You faint.  You have a sudden increase in pelvic pain.   This information is not intended to replace advice given to you by your health care provider. Make sure you discuss any questions you have with your health care provider.   Document Released: 04/17/2000 Document Revised: 05/11/2014 Document Reviewed: 10/17/2013 Elsevier Interactive Patient Education 2016 Elsevier Inc.  

## 2015-10-21 NOTE — Addendum Note (Signed)
Addended by: Terrance Mass on: 10/21/2015 12:21 PM   Modules accepted: Orders

## 2015-10-22 ENCOUNTER — Other Ambulatory Visit: Payer: Self-pay | Admitting: Gynecology

## 2015-10-22 DIAGNOSIS — E559 Vitamin D deficiency, unspecified: Secondary | ICD-10-CM

## 2015-10-22 LAB — URINALYSIS W MICROSCOPIC + REFLEX CULTURE
Bacteria, UA: NONE SEEN [HPF]
Bilirubin Urine: NEGATIVE
CASTS: NONE SEEN [LPF]
Crystals: NONE SEEN [HPF]
GLUCOSE, UA: NEGATIVE
HGB URINE DIPSTICK: NEGATIVE
Ketones, ur: NEGATIVE
NITRITE: NEGATIVE
PH: 6.5 (ref 5.0–8.0)
Protein, ur: NEGATIVE
RBC / HPF: NONE SEEN RBC/HPF (ref <=2)
Specific Gravity, Urine: 1.006 (ref 1.001–1.035)
YEAST: NONE SEEN [HPF]

## 2015-10-22 LAB — VITAMIN D 25 HYDROXY (VIT D DEFICIENCY, FRACTURES): VIT D 25 HYDROXY: 24 ng/mL — AB (ref 30–100)

## 2015-10-22 MED ORDER — VITAMIN D (ERGOCALCIFEROL) 1.25 MG (50000 UNIT) PO CAPS: 50000.0000 [IU] | ORAL_CAPSULE | ORAL | Status: DC

## 2015-10-22 NOTE — Progress Notes (Signed)
Have her take 5000 units daily and we'll recheck her vitamin D level in 3 months

## 2015-10-23 ENCOUNTER — Ambulatory Visit (INDEPENDENT_AMBULATORY_CARE_PROVIDER_SITE_OTHER): Payer: BLUE CROSS/BLUE SHIELD

## 2015-10-23 ENCOUNTER — Other Ambulatory Visit: Payer: Self-pay | Admitting: Gynecology

## 2015-10-23 ENCOUNTER — Ambulatory Visit (INDEPENDENT_AMBULATORY_CARE_PROVIDER_SITE_OTHER): Payer: BLUE CROSS/BLUE SHIELD | Admitting: Gynecology

## 2015-10-23 ENCOUNTER — Encounter: Payer: Self-pay | Admitting: Gynecology

## 2015-10-23 VITALS — BP 146/94

## 2015-10-23 DIAGNOSIS — R102 Pelvic and perineal pain: Secondary | ICD-10-CM

## 2015-10-23 DIAGNOSIS — N852 Hypertrophy of uterus: Secondary | ICD-10-CM | POA: Diagnosis not present

## 2015-10-23 DIAGNOSIS — N838 Other noninflammatory disorders of ovary, fallopian tube and broad ligament: Secondary | ICD-10-CM

## 2015-10-23 DIAGNOSIS — D251 Intramural leiomyoma of uterus: Secondary | ICD-10-CM

## 2015-10-23 DIAGNOSIS — N839 Noninflammatory disorder of ovary, fallopian tube and broad ligament, unspecified: Secondary | ICD-10-CM

## 2015-10-23 LAB — URINE CULTURE
Colony Count: NO GROWTH
ORGANISM ID, BACTERIA: NO GROWTH

## 2015-10-23 NOTE — Progress Notes (Signed)
   Patient is a 50 year old gravida 0 who presented to the office today to discuss her ultrasound. She was seen in the office in June 19 for her annual gynecological examination and had only been complaining of low abdominal pelvic pressure. Patient with known past history of fibroid uterus. Patient also had a Mirena IUD placed several years ago. 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   Patient's ultrasound in September 2015 had demonstrated the following: Patient with past history of fibroid uterus had ultrasound September 2015 demonstrated the following: Uterus measured 10.2 x 8.7 x 5.6 cm with endometrial stripe of 4.2 mm. Once again for uterine fibroids were noted the largest one measuring 59 x 47 mm unchanged. Right ovary normal small follicle measuring 20 x 19 mm. Left ovary was normal. Previous cyst no longer present just a small 25 x 20 mm follicle. IUD was seen in the right position   Ultrasound today: Uterus measured 14.5 x 12.9 x 7.7 cm with endometrial stripe of 3.7 mm. IUD was found to be in the normal position. Patient had several fibroids as follows: 5.4 x 4.5 cm, 2.6 x 3.3 cm, 2.3 x 3.1 cm, 2.9 x 2.5 cm, 4.9 x 3.9 cm, 2.6 x 2.2 cm, and 4.3 x 5.1 cm, and 3.6 x 5.0 cm. The right ovary was normal. The left ovary had a thinwall multiseptated cystic solid mass measuring 4.6 x 3.9 x 4.4 cm with negative color flow. Arterial blood flow was seen to the left ovary. There was no fluid in the cul-de-sac.  Assessment/plan: Patient with multilobulated uterus appears to have some increase in size from several years ago. But now a suspicious septated cystic solid mass on the left ovary. We are going to order an ovarian cancer screening tests ROMA-1 and I would like to have the opinion of GYN oncologist Dr. Denman George or her partner's to see her in consultation. I have provided this information to the patient and literature information  on ovarian cysts had been provided as well.  Time of consultation 10 minutes

## 2015-10-23 NOTE — Patient Instructions (Signed)
Uterine Fibroids Uterine fibroids are tissue masses (tumors) that can develop in the womb (uterus). They are also called leiomyomas. This type of tumor is not cancerous (benign) and does not spread to other parts of the body outside of the pelvic area, which is between the hip bones. Occasionally, fibroids may develop in the fallopian tubes, in the cervix, or on the support structures (ligaments) that surround the uterus. You can have one or many fibroids. Fibroids can vary in size, weight, and where they grow in the uterus. Some can become quite large. Most fibroids do not require medical treatment. CAUSES A fibroid can develop when a single uterine cell keeps growing (replicating). Most cells in the human body have a control mechanism that keeps them from replicating without control. SIGNS AND SYMPTOMS Symptoms may include:   Heavy bleeding during your period.  Bleeding or spotting between periods.  Pelvic pain and pressure.  Bladder problems, such as needing to urinate more often (urinary frequency) or urgently.  Inability to reproduce offspring (infertility).  Miscarriages. DIAGNOSIS Uterine fibroids are diagnosed through a physical exam. Your health care provider may feel the lumpy tumors during a pelvic exam. Ultrasonography and an MRI may be done to determine the size, location, and number of fibroids. TREATMENT Treatment may include:  Watchful waiting. This involves getting the fibroid checked by your health care provider to see if it grows or shrinks. Follow your health care provider's recommendations for how often to have this checked.  Hormone medicines. These can be taken by mouth or given through an intrauterine device (IUD).  Surgery.  Removing the fibroids (myomectomy) or the uterus (hysterectomy).  Removing blood supply to the fibroids (uterine artery embolization). If fibroids interfere with your fertility and you want to become pregnant, your health care provider  may recommend having the fibroids removed.  HOME CARE INSTRUCTIONS  Keep all follow-up visits as directed by your health care provider. This is important.  Take medicines only as directed by your health care provider.  If you were prescribed a hormone treatment, take the hormone medicines exactly as directed.  Do not take aspirin, because it can cause bleeding.  Ask your health care provider about taking iron pills and increasing the amount of dark green, leafy vegetables in your diet. These actions can help to boost your blood iron levels, which may be affected by heavy menstrual bleeding.  Pay close attention to your period and tell your health care provider about any changes, such as:  Increased blood flow that requires you to use more pads or tampons than usual per month.  A change in the number of days that your period lasts per month.  A change in symptoms that are associated with your period, such as abdominal cramping or back pain. SEEK MEDICAL CARE IF:  You have pelvic pain, back pain, or abdominal cramps that cannot be controlled with medicines.  You have an increase in bleeding between and during periods.  You soak tampons or pads in a half hour or less.  You feel lightheaded, extra tired, or weak. SEEK IMMEDIATE MEDICAL CARE IF:  You faint.  You have a sudden increase in pelvic pain.   This information is not intended to replace advice given to you by your health care provider. Make sure you discuss any questions you have with your health care provider.   Document Released: 04/17/2000 Document Revised: 05/11/2014 Document Reviewed: 10/17/2013 Elsevier Interactive Patient Education 2016 Elsevier Inc. Ovarian Cyst An ovarian cyst is a  fluid-filled sac that forms on an ovary. The ovaries are small organs that produce eggs in women. Various types of cysts can form on the ovaries. Most are not cancerous. Many do not cause problems, and they often go away on their own.  Some may cause symptoms and require treatment. Common types of ovarian cysts include:  Functional cysts--These cysts may occur every month during the menstrual cycle. This is normal. The cysts usually go away with the next menstrual cycle if the woman does not get pregnant. Usually, there are no symptoms with a functional cyst.  Endometrioma cysts--These cysts form from the tissue that lines the uterus. They are also called "chocolate cysts" because they become filled with blood that turns brown. This type of cyst can cause pain in the lower abdomen during intercourse and with your menstrual period.  Cystadenoma cysts--This type develops from the cells on the outside of the ovary. These cysts can get very big and cause lower abdomen pain and pain with intercourse. This type of cyst can twist on itself, cut off its blood supply, and cause severe pain. It can also easily rupture and cause a lot of pain.  Dermoid cysts--This type of cyst is sometimes found in both ovaries. These cysts may contain different kinds of body tissue, such as skin, teeth, hair, or cartilage. They usually do not cause symptoms unless they get very big.  Theca lutein cysts--These cysts occur when too much of a certain hormone (human chorionic gonadotropin) is produced and overstimulates the ovaries to produce an egg. This is most common after procedures used to assist with the conception of a baby (in vitro fertilization). CAUSES   Fertility drugs can cause a condition in which multiple large cysts are formed on the ovaries. This is called ovarian hyperstimulation syndrome.  A condition called polycystic ovary syndrome can cause hormonal imbalances that can lead to nonfunctional ovarian cysts. SIGNS AND SYMPTOMS  Many ovarian cysts do not cause symptoms. If symptoms are present, they may include:  Pelvic pain or pressure.  Pain in the lower abdomen.  Pain during sexual intercourse.  Increasing girth (swelling) of the  abdomen.  Abnormal menstrual periods.  Increasing pain with menstrual periods.  Stopping having menstrual periods without being pregnant. DIAGNOSIS  These cysts are commonly found during a routine or annual pelvic exam. Tests may be ordered to find out more about the cyst. These tests may include:  Ultrasound.  X-ray of the pelvis.  CT scan.  MRI.  Blood tests. TREATMENT  Many ovarian cysts go away on their own without treatment. Your health care provider may want to check your cyst regularly for 2-3 months to see if it changes. For women in menopause, it is particularly important to monitor a cyst closely because of the higher rate of ovarian cancer in menopausal women. When treatment is needed, it may include any of the following:  A procedure to drain the cyst (aspiration). This may be done using a long needle and ultrasound. It can also be done through a laparoscopic procedure. This involves using a thin, lighted tube with a tiny camera on the end (laparoscope) inserted through a small incision.  Surgery to remove the whole cyst. This may be done using laparoscopic surgery or an open surgery involving a larger incision in the lower abdomen.  Hormone treatment or birth control pills. These methods are sometimes used to help dissolve a cyst. HOME CARE INSTRUCTIONS   Only take over-the-counter or prescription medicines as directed by  your health care provider.  Follow up with your health care provider as directed.  Get regular pelvic exams and Pap tests. SEEK MEDICAL CARE IF:   Your periods are late, irregular, or painful, or they stop.  Your pelvic pain or abdominal pain does not go away.  Your abdomen becomes larger or swollen.  You have pressure on your bladder or trouble emptying your bladder completely.  You have pain during sexual intercourse.  You have feelings of fullness, pressure, or discomfort in your stomach.  You lose weight for no apparent reason.  You  feel generally ill.  You become constipated.  You lose your appetite.  You develop acne.  You have an increase in body and facial hair.  You are gaining weight, without changing your exercise and eating habits.  You think you are pregnant. SEEK IMMEDIATE MEDICAL CARE IF:   You have increasing abdominal pain.  You feel sick to your stomach (nauseous), and you throw up (vomit).  You develop a fever that comes on suddenly.  You have abdominal pain during a bowel movement.  Your menstrual periods become heavier than usual. MAKE SURE YOU:  Understand these instructions.  Will watch your condition.  Will get help right away if you are not doing well or get worse.   This information is not intended to replace advice given to you by your health care provider. Make sure you discuss any questions you have with your health care provider.   Document Released: 04/20/2005 Document Revised: 04/25/2013 Document Reviewed: 12/26/2012 Elsevier Interactive Patient Education [employer].

## 2015-10-25 ENCOUNTER — Telehealth: Payer: Self-pay | Admitting: *Deleted

## 2015-10-25 MED ORDER — CITALOPRAM HYDROBROMIDE 20 MG PO TABS: 20.0000 mg | ORAL_TABLET | Freq: Every day | ORAL | Status: DC

## 2015-10-25 NOTE — Telephone Encounter (Signed)
Pt has appointment scheduled with Dr.Rossi on 11/08/15, pt asked if you still want her to keep scheduled appointment on 10/28/15 for Mirena IUD removal and insert? And can she have increase on celexa from 10 gm to 20 mg , states the 10 mg dose is not longer working. Please advise

## 2015-10-25 NOTE — Telephone Encounter (Signed)
Appointment on 11/08/15 @ 8:30am with Dr.Rossi pt aware of time and date.

## 2015-10-25 NOTE — Telephone Encounter (Signed)
-----   Message from Terrance Mass, MD sent at 10/23/2015  3:31 PM EDT ----- Anderson Malta, please make an appointment for this patient to see Dr. Denman George GYN oncologist first week in July. Blood work drawn today pending. Patient with fibroid uterus and left adnexal mass suspicious ovarian cyst

## 2015-10-25 NOTE — Telephone Encounter (Signed)
Pt aware, appointment will be cancelled. Rx sent.

## 2015-10-25 NOTE — Telephone Encounter (Signed)
Can increase Celexa to 20 mg q daily. Call in # 30 with 11 refills. Does not need to come in to change IUD yet. Lets wait to see what Dr. Denman George has to offer. Blood test ROMA-1 result still pending usually takes a few days.

## 2015-10-28 ENCOUNTER — Ambulatory Visit: Payer: Self-pay | Admitting: Gynecology

## 2015-10-30 ENCOUNTER — Encounter: Payer: Self-pay | Admitting: Gynecology

## 2015-10-30 LAB — OVARIAN MALIGNANCY RISK-ROMA
CA125: 18 U/mL (ref <35)
HE4: 43 pM (ref <151)
ROMA POSTMENOPAUSAL: 1.13 (ref <2.77)
ROMA Premenopausal: 0.54 (ref <1.31)

## 2015-11-08 ENCOUNTER — Encounter: Payer: Self-pay | Admitting: Gynecologic Oncology

## 2015-11-08 ENCOUNTER — Ambulatory Visit: Payer: BLUE CROSS/BLUE SHIELD | Attending: Gynecologic Oncology | Admitting: Gynecologic Oncology

## 2015-11-08 VITALS — BP 147/90 | HR 79 | Temp 98.1°F | Resp 18 | Ht 65.0 in | Wt 141.0 lb

## 2015-11-08 DIAGNOSIS — D259 Leiomyoma of uterus, unspecified: Secondary | ICD-10-CM | POA: Diagnosis not present

## 2015-11-08 DIAGNOSIS — N83202 Unspecified ovarian cyst, left side: Secondary | ICD-10-CM | POA: Diagnosis not present

## 2015-11-08 DIAGNOSIS — Z975 Presence of (intrauterine) contraceptive device: Secondary | ICD-10-CM | POA: Diagnosis not present

## 2015-11-08 DIAGNOSIS — E559 Vitamin D deficiency, unspecified: Secondary | ICD-10-CM | POA: Insufficient documentation

## 2015-11-08 DIAGNOSIS — N838 Other noninflammatory disorders of ovary, fallopian tube and broad ligament: Secondary | ICD-10-CM

## 2015-11-08 NOTE — Progress Notes (Signed)
Consult Note: Gyn-Onc  Consult was requested by Dr. Toney Rakes for the evaluation of Sharon Conrad 50 y.o. female  CC:  Chief Complaint  Patient presents with  . Left ovarian mass , fibroids    New Consultation    Assessment/Plan:  Ms. TYESHA GRASSEL  is a 50 y.o.  year old with a left ovarian cyst measuring 4.6cm and a symptomatic fibroid uterus. Her tumor markers are normal.  She is electing for definitive surgery with hysterectomy, LSO.   I discussed with the patient that an alternative to surgery would be expectant management with repeat US and tumor marker assessment. However, the patient would prefer for definitive management and has bulk symptoms from her fibroids.  I think she is a good candidate for a minimally invasive approach with robotically assisted total hysterectomy (uterus >250gm) and LSO. We will perform frozen section at that time. If malignancy is identified, staging will be performed. I will perform contained vaginal morcellation for specimen delivery, or minlaparotomy.  I discussed operative risks including  bleeding, infection, damage to internal organs (such as bladder,ureters, bowels), blood clot, reoperation and rehospitalization. I discussed that hysterectomy results in permanent infertility.   HPI: Sharon Conrad is a 50 year old G0 who is seen in consultation at the request of Dr Toney Rakes for a left ovarian complex cyst and symptomatic uterine fibroids. The patient has a long-standing history of lower abdominal pelvic pressure and is known to have a history of a fibroid uterus. An ultrasound in September 2015 measured here is 10.2 cm in greatest dimension with an endometrial stripe of 4.2 mm. The left ovary was normal at time and the right ovary contained a follicular cyst. An IUD was placed in the uterus to control symptoms of menorrhagia and pain and did so however she continued to have symptoms of pelvic pressure. A repeat ultrasound was performed  on 10/23/2015 by Dr. Toney Rakes. It was performed because of her symptoms of persistent lower abdominal pelvic pressure. The uterus on that repeat exam and showing some growth in size and now measured 14.5 x 12.9 x 7.7 cm with an endometrial stripe of 23.7 mm. An IUD was seen. Patient had several intramural fibroids identified. The right ovary was normal. The left ovary contained a thin walled multiseptated cystic and solid mass measuring 4.6 x 3.9 x 4.4 cm with no color-flow seen. She had tumor markers assessed on 10/31/2015 and these were normal including a normal pre-and postmenopausal ROMA score, and normal CA-125 at 16 and the normal 8443.  She is thin and otherwise very healthy. She has a history of a prior open appendectomy at age 5.   Current Meds:  Outpatient Encounter Prescriptions as of 11/08/2015  Medication Sig  . ALPRAZolam (XANAX) 0.25 MG tablet Take 1 tablet (0.25 mg total) by mouth at bedtime as needed for anxiety.  Marland Kitchen BEE POLLEN PO Take by mouth.  . Bilberry, Vaccinium myrtillus, (BILBERRY EXTRACT PO) Take by mouth.  . Cholecalciferol (VITAMIN D) 2000 UNITS tablet Take 2,000 Units by mouth daily.  . citalopram (CELEXA) 20 MG tablet Take 1 tablet (20 mg total) by mouth daily.  . fluticasone (FLONASE) 50 MCG/ACT nasal spray Place 2 sprays into the nose daily.  Marland Kitchen levonorgestrel (MIRENA) 20 MCG/24HR IUD 1 each by Intrauterine route once.  . Vitamin D, Ergocalciferol, (DRISDOL) 50000 units CAPS capsule Take 1 capsule (50,000 Units total) by mouth every 7 (seven) days.  . [DISCONTINUED] spironolactone (ALDACTONE) 50 MG tablet TAKE 1 TABLET (50  MG TOTAL) BY MOUTH DAILY. (Patient not taking: Reported on 10/21/2015)  . [DISCONTINUED] Vitamin D, Ergocalciferol, (DRISDOL) 50000 UNITS CAPS capsule Take 1 capsule (50,000 Units total) by mouth every 7 (seven) days. (Patient not taking: Reported on 10/21/2015)   No facility-administered encounter medications on file as of 11/08/2015.    Allergy:   Allergies  Allergen Reactions  . Codeine Nausea And Vomiting    Nausea and vomitin  . Shellfish Allergy Hives    Social Hx:   Social History   Social History  . Marital Status: Married    Spouse Name: N/A  . Number of Children: N/A  . Years of Education: N/A   Occupational History  . Not on file.   Social History Main Topics  . Smoking status: Never Smoker   . Smokeless tobacco: Never Used  . Alcohol Use: 1.2 oz/week    2 Glasses of wine per week     Comment: couple times a week  . Drug Use: No  . Sexual Activity:    Partners: Male    Birth Control/ Protection: IUD   Other Topics Concern  . Not on file   Social History Narrative    Past Surgical Hx:  Past Surgical History  Procedure Laterality Date  . Leep  1992  . Appendectomy      AGE 34  . Intrauterine device insertion  6/12    Mirena    Past Medical Hx:  Past Medical History  Diagnosis Date  . Cervical dysplasia     CIN - 1--LEEP 1992  . Fibroid   . Vitamin D deficiency   . Environmental allergies   . Allergy     Past Gynecological History:  G0  No LMP recorded. Patient is not currently having periods (Reason: IUD).  Family Hx:  Family History  Problem Relation Age of Onset  . Hypertension Mother   . Cancer Mother     COLON CANCER 60'S  . Colon polyps Mother   . Cancer Father     lung  . Colon cancer Neg Hx     Review of Systems:  Constitutional  Feels well,    ENT Normal appearing ears and nares bilaterally Skin/Breast  No rash, sores, jaundice, itching, dryness Cardiovascular  No chest pain, shortness of breath, or edema  Pulmonary  No cough or wheeze.  Gastro Intestinal  No nausea, vomitting, or diarrhoea. No bright red blood per rectum, no abdominal pain, change in bowel movement, or constipation.  Genito Urinary  No frequency, urgency, dysuria, + pelvic pressure Musculo Skeletal  No myalgia, arthralgia, joint swelling or pain  Neurologic  No weakness, numbness,  change in gait,  Psychology  No depression, anxiety, insomnia.   Vitals:  Blood pressure 147/90, pulse 79, temperature 98.1 F (36.7 C), temperature source Oral, resp. rate 18, height 5\' 5"  (1.651 m), weight 141 lb (63.957 kg), SpO2 99 %.  Physical Exam: WD in NAD Neck  Supple NROM, without any enlargements.  Lymph Node Survey No cervical supraclavicular or inguinal adenopathy Cardiovascular  Pulse normal rate, regularity and rhythm. S1 and S2 normal.  Lungs  Clear to auscultation bilateraly, without wheezes/crackles/rhonchi. Good air movement.  Skin  No rash/lesions/breakdown  Psychiatry  Alert and oriented to person, place, and time  Abdomen  Normoactive bowel sounds, abdomen soft, non-tender and thin without evidence of hernia.  Back No CVA tenderness Genito Urinary  Vulva/vagina: Normal external female genitalia.  No lesions. No discharge or bleeding.  Bladder/urethra:  No lesions  or masses, well supported bladder  Vagina: normal  Cervix: Normal appearing, no lesions.  Uterus: bulky, 14cm size, mobile, no parametrial involvement or nodularity.  Adnexa: no palpable masses. Rectal  Good tone, no masses no cul de sac nodularity.  Extremities  No bilateral cyanosis, clubbing or edema.   Donaciano Eva, MD  11/08/2015, 3:47 PM

## 2015-11-08 NOTE — Patient Instructions (Addendum)
Preparing for your Surgery  Plan for surgery on July 25 with Dr. Everitt Amber at [place] will be scheduled for a robotic assisted total hysterectomy, bilateral salpingo-oophorectomy.    Pre-operative Testing -You will receive a phone call from presurgical testing at [facility] to arrange for a pre-operative testing appointment before your surgery.  This appointment normally occurs one to two weeks before your scheduled surgery.   -Bring your insurance card, copy of an advanced directive if applicable, medication list  -At that visit, you will be asked to sign a consent for a possible blood transfusion in case a transfusion becomes necessary during surgery.  The need for a blood transfusion is rare but having consent is a necessary part of your care.     -You should not be taking blood thinners or aspirin at least ten days prior to surgery unless instructed by your surgeon.  Day Before Surgery at [place] will be asked to take in a light diet the day before surgery.  Avoid carbonated beverages.  You will be advised to have nothing to eat or drink after midnight the evening before.     Eat a light diet the day before surgery.  Examples including soups, broths, toast, yogurt, mashed potatoes.  Things to avoid include carbonated beverages (fizzy beverages), raw fruits and raw vegetables, or beans.    If your bowels are filled with gas, your surgeon will have difficulty visualizing your pelvic organs which increases your surgical risks.  Your role in recovery Your role is to become active as soon as directed by your doctor, while still giving yourself time to heal.  Rest when you feel tired. You will be asked to do the following in order to speed your recovery:  - Cough and breathe deeply. This helps toclear and expand your lungs and can prevent pneumonia. You may be given a spirometer to practice deep breathing. A staff member will show you how to use the  spirometer. - Do mild physical activity. Walking or moving your legs help your circulation and body functions return to normal. A staff member will help you when you try to walk and will provide you with simple exercises. Do not try to get up or walk alone the first time. - Actively manage your pain. Managing your pain lets you move in comfort. We will ask you to rate your pain on a scale of zero to 10. It is your responsibility to tell your doctor or nurse where and how much you hurt so your pain can be treated.  Special Considerations -If you are diabetic, you may be placed on insulin after surgery to have closer control over your blood sugars to promote healing and recovery.  This does not mean that you will be discharged on insulin.  If applicable, your oral antidiabetics will be resumed when you are tolerating a solid diet.  -Your final pathology results from surgery should be available by the Friday after surgery and the results will be relayed to you when available.  Blood Transfusion Information WHAT IS A BLOOD TRANSFUSION? A transfusion is the replacement of blood or some of its parts. Blood is made up of multiple cells which provide different functions.  Red blood cells carry oxygen and are used for blood loss replacement.  White blood cells fight against infection.  Platelets control bleeding.  Plasma helps clot blood.  Other blood products are available for specialized needs, such as hemophilia or other clotting disorders.  BEFORE THE TRANSFUSION  Who gives blood for transfusions?   You may be able to donate blood to be used at a later date on yourself (autologous donation).  Relatives can be asked to donate blood. This is generally not any safer than if you have received blood from a stranger. The same precautions are taken to ensure safety when a relative's blood is donated.  Healthy volunteers who are fully evaluated to make sure their blood is safe. This is blood bank  blood. Transfusion therapy is the safest it has ever been in the practice of medicine. Before blood is taken from a donor, a complete history is taken to make sure that person has no history of diseases nor engages in risky social behavior (examples are intravenous drug use or sexual activity with multiple partners). The donor's travel history is screened to minimize risk of transmitting infections, such as malaria. The donated blood is tested for signs of infectious diseases, such as HIV and hepatitis. The blood is then tested to be sure it is compatible with you in order to minimize the chance of a transfusion reaction. If you or a relative donates blood, this is often done in anticipation of surgery and is not appropriate for emergency situations. It takes many days to process the donated blood. RISKS AND COMPLICATIONS Although transfusion therapy is very safe and saves many lives, the main dangers of transfusion include:   Getting an infectious disease.  Developing a transfusion reaction. This is an allergic reaction to something in the blood you were given. Every precaution is taken to prevent this. The decision to have a blood transfusion has been considered carefully by your caregiver before blood is given. Blood is not given unless the benefits outweigh the risks.

## 2015-11-20 NOTE — Patient Instructions (Signed)
Sharon Conrad  11/20/2015   Your procedure is scheduled on: 11/26/2015    Report to [facility] Main  Entrance take [place]  elevators to 3rd floor to  [place] at   [place] AM.  Call this number if you have problems the morning of surgery [phone]   Remember: ONLY 1 PERSON MAY GO WITH YOU TO SHORT STAY TO GET  READY MORNING OF [name].  Do not eat food or drink liquids :After Midnight.             Eat a light diet the day before surgery.  Soups, broths, toast, yogurt and mashed potatoes.  Also clear liquids.  Avoid carbonated beverages ( fizzy beverages), raw fruits and vegetables and beans.       Take these medicines the morning of surgery with A SIP OF WATER: Citalopram ( Celexa), Flonase if needed                                 You may not have any metal on your body including hair pins and              piercings  Do not wear jewelry, make-up, lotions, powders or perfumes, deodorant             Do not wear nail polish.  Do not shave  48 hours prior to surgery.     Do not bring valuables to the hospital. [place].  Contacts, dentures or bridgework may not be worn into surgery.  Leave suitcase in the car. After surgery it may be brought to your room.       Special Instructions: coughing and deep breathing exercises,leg exercises               Please read over the following fact sheets you were given: _____________________________________________________________________                CLEAR LIQUID DIET   Foods Allowed                                                                     Foods Excluded  Coffee and tea, regular and decaf                             liquids that you cannot  Plain Jell-O in any flavor                                             see through such as: Fruit ices (not with fruit pulp)                                     milk, soups, orange juice  Iced  Popsicles  All solid food Carbonated beverages, regular and diet                                    Cranberry, grape and apple juices Sports drinks like Gatorade Lightly seasoned clear broth or consume(fat free) Sugar, honey syrup  Sample Menu Breakfast                                Lunch                                     Supper Cranberry juice                    Beef broth                            Chicken broth Jell-O                                     Grape juice                           Apple juice Coffee or tea                        Jell-O                                      Popsicle                                                Coffee or tea                        Coffee or tea  _____________________________________________________________________  [facility] - Preparing for Surgery Before surgery, you can play an important role.  Because skin is not sterile, your skin needs to be as free of germs as possible.  You can reduce the number of germs on your skin by washing with CHG (chlorahexidine gluconate) soap before surgery.  CHG is an antiseptic cleaner which kills germs and bonds with the skin to continue killing germs even after washing. Please DO NOT use if you have an allergy to CHG or antibacterial soaps.  If your skin becomes reddened/irritated stop using the CHG and inform your nurse when you arrive at Short Stay. Do not shave (including legs and underarms) for at least 48 hours prior to the first CHG shower.  You may shave your face/neck. Please follow these instructions carefully:  1.  Shower with CHG Soap the night before surgery and the  morning of Surgery.  2.  If you choose to wash your hair, wash your hair first as usual with your  normal  shampoo.  3.  After you shampoo, rinse your hair and body thoroughly to remove the  shampoo.  4.  Use CHG as you would any other liquid soap.  You can apply chg  directly  to the skin and wash                       Gently with a scrungie or clean washcloth.  5.  Apply the CHG Soap to your body ONLY FROM THE NECK DOWN.   Do not use on face/ open                           Wound or open sores. Avoid contact with eyes, ears mouth and genitals (private parts).                       Wash face,  Genitals (private parts) with your normal soap.             6.  Wash thoroughly, paying special attention to the area where your surgery  will be performed.  7.  Thoroughly rinse your body with warm water from the neck down.  8.  DO NOT shower/wash with your normal soap after using and rinsing off  the CHG Soap.                9.  Pat yourself dry with a clean towel.            10.  Wear clean pajamas.            11.  Place clean sheets on your bed the night of your first shower and do not  sleep with pets. Day of Surgery : Do not apply any lotions/deodorants the morning of surgery.  Please wear clean clothes to the hospital/surgery center.  FAILURE TO FOLLOW THESE INSTRUCTIONS MAY RESULT IN THE CANCELLATION OF YOUR SURGERY PATIENT SIGNATURE_________________________________  NURSE SIGNATURE__________________________________  ________________________________________________________________________  WHAT IS A BLOOD TRANSFUSION? Blood Transfusion Information  A transfusion is the replacement of blood or some of its parts. Blood is made up of multiple cells which provide different functions.  Red blood cells carry oxygen and are used for blood loss replacement.  White blood cells fight against infection.  Platelets control bleeding.  Plasma helps clot blood.  Other blood products are available for specialized needs, such as hemophilia or other clotting disorders. BEFORE THE TRANSFUSION  Who gives blood for transfusions?   Healthy volunteers who are fully evaluated to make sure their blood is safe. This is blood bank blood. Transfusion therapy is the safest  it has ever been in the practice of medicine. Before blood is taken from a donor, a complete history is taken to make sure that person has no history of diseases nor engages in risky social behavior (examples are intravenous drug use or sexual activity with multiple partners). The donor's travel history is screened to minimize risk of transmitting infections, such as malaria. The donated blood is tested for signs of infectious diseases, such as HIV and hepatitis. The blood is then tested to be sure it is compatible with you in order to minimize the chance of a transfusion reaction. If you or a relative donates blood, this is often done in anticipation of surgery and is not appropriate for emergency situations. It takes many days to process the donated blood. RISKS AND COMPLICATIONS Although transfusion therapy is very safe and saves many lives, the main dangers of transfusion include:  1. Getting an infectious disease. 2. Developing a transfusion reaction.  This is an allergic reaction to something in the blood you were given. Every precaution is taken to prevent this. The decision to have a blood transfusion has been considered carefully by your caregiver before blood is given. Blood is not given unless the benefits outweigh the risks. AFTER THE TRANSFUSION  Right after receiving a blood transfusion, you will usually feel much better and more energetic. This is especially true if your red blood cells have gotten low (anemic). The transfusion raises the level of the red blood cells which carry oxygen, and this usually causes an energy increase.  The nurse administering the transfusion will monitor you carefully for complications. HOME CARE INSTRUCTIONS  No special instructions are needed after a transfusion. You may find your energy is better. Speak with your caregiver about any limitations on activity for underlying diseases you may have. SEEK MEDICAL CARE IF:   Your condition is not improving after  your transfusion.  You develop redness or irritation at the intravenous (IV) site. SEEK IMMEDIATE MEDICAL CARE IF:  Any of the following symptoms occur over the next 12 hours:  Shaking chills.  You have a temperature by mouth above 102 F (38.9 C), not controlled by medicine.  Chest, back, or muscle pain.  People around you feel you are not acting correctly or are confused.  Shortness of breath or difficulty breathing.  Dizziness and fainting.  You get a rash or develop hives.  You have a decrease in urine output.  Your urine turns a dark color or changes to pink, red, or brown. Any of the following symptoms occur over the next 10 days:  You have a temperature by mouth above 102 F (38.9 C), not controlled by medicine.  Shortness of breath.  Weakness after normal activity.  The white part of the eye turns yellow (jaundice).  You have a decrease in the amount of urine or are urinating less often.  Your urine turns a dark color or changes to pink, red, or brown. Document Released: 04/17/2000 Document Revised: 07/13/2011 Document Reviewed: 12/05/2007 ExitCare Patient Information 2014 [place].  _______________________________________________________________________  Incentive Spirometer  An incentive spirometer is a tool that can help keep your lungs clear and active. This tool measures how well you are filling your lungs with each breath. Taking long deep breaths may help reverse or decrease the chance of developing breathing (pulmonary) problems (especially infection) following:  A long period of time when you are unable to move or be active. BEFORE THE PROCEDURE   If the spirometer includes an indicator to show your best effort, your nurse or respiratory therapist will set it to a desired goal.  If possible, sit up straight or lean slightly forward. Try not to slouch.  Hold the incentive spirometer in an upright position. INSTRUCTIONS FOR USE  3. Sit on  the edge of your bed if possible, or sit up as far as you can in bed or on a chair. 4. Hold the incentive spirometer in an upright position. 5. Breathe out normally. 6. Place the mouthpiece in your mouth and seal your lips tightly around it. 7. Breathe in slowly and as deeply as possible, raising the piston or the ball toward the top of the column. 8. Hold your breath for 3-5 seconds or for as long as possible. Allow the piston or ball to fall to the bottom of the column. 9. Remove the mouthpiece from your mouth and breathe out normally. 10. Rest for a few seconds and repeat Steps  1 through 7 at least 10 times every 1-2 hours when you are awake. Take your time and take a few normal breaths between deep breaths. 11. The spirometer may include an indicator to show your best effort. Use the indicator as a goal to work toward during each repetition. 12. After each set of 10 deep breaths, practice coughing to be sure your lungs are clear. If you have an incision (the cut made at the time of surgery), support your incision when coughing by placing a pillow or rolled up towels firmly against it. Once you are able to get out of bed, walk around indoors and cough well. You may stop using the incentive spirometer when instructed by your caregiver.  RISKS AND COMPLICATIONS  Take your time so you do not get dizzy or light-headed.  If you are in pain, you may need to take or ask for pain medication before doing incentive spirometry. It is harder to take a deep breath if you are having pain. AFTER USE  Rest and breathe slowly and easily.  It can be helpful to keep track of a log of your progress. Your caregiver can provide you with a simple table to help with this. If you are using the spirometer at home, follow these instructions: [place] IF:   You are having difficultly using the spirometer.  You have trouble using the spirometer as often as instructed.  Your pain medication is not giving  enough relief while using the spirometer.  You develop fever of 100.5 F (38.1 C) or higher. SEEK IMMEDIATE MEDICAL CARE IF:   You cough up bloody sputum that had not been present before.  You develop fever of 102 F (38.9 C) or greater.  You develop worsening pain at or near the incision site. MAKE SURE YOU:   Understand these instructions.  Will watch your condition.  Will get help right away if you are not doing well or get worse. Document Released: 08/31/2006 Document Revised: 07/13/2011 Document Reviewed: 11/01/2006 [facility] Patient Information 2014 [place], [place].   ________________________________________________________________________

## 2015-11-21 ENCOUNTER — Encounter (HOSPITAL_COMMUNITY): Payer: Self-pay

## 2015-11-21 ENCOUNTER — Encounter (HOSPITAL_COMMUNITY)
Admission: RE | Admit: 2015-11-21 | Discharge: 2015-11-21 | Disposition: A | Discharge status: Home / Self Care | Payer: BLUE CROSS/BLUE SHIELD | Source: Ambulatory Visit | Attending: Gynecologic Oncology | Admitting: Gynecologic Oncology

## 2015-11-21 DIAGNOSIS — Z01812 Encounter for preprocedural laboratory examination: Secondary | ICD-10-CM | POA: Diagnosis not present

## 2015-11-21 DIAGNOSIS — D259 Leiomyoma of uterus, unspecified: Secondary | ICD-10-CM | POA: Insufficient documentation

## 2015-11-21 DIAGNOSIS — N83209 Unspecified ovarian cyst, unspecified side: Secondary | ICD-10-CM | POA: Insufficient documentation

## 2015-11-21 HISTORY — DX: Anxiety disorder, unspecified: F41.9

## 2015-11-21 LAB — CBC WITH DIFFERENTIAL/PLATELET
BASOS ABS: 0 10*3/uL (ref 0.0–0.1)
Basophils Relative: 1 %
EOS PCT: 2 %
Eosinophils Absolute: 0.1 10*3/uL (ref 0.0–0.7)
HEMATOCRIT: 44.7 % (ref 36.0–46.0)
HEMOGLOBIN: 14.9 g/dL (ref 12.0–15.0)
LYMPHS ABS: 1.8 10*3/uL (ref 0.7–4.0)
LYMPHS PCT: 34 %
MCH: 29.5 pg (ref 26.0–34.0)
MCHC: 33.3 g/dL (ref 30.0–36.0)
MCV: 88.5 fL (ref 78.0–100.0)
Monocytes Absolute: 0.3 10*3/uL (ref 0.1–1.0)
Monocytes Relative: 6 %
NEUTROS ABS: 3 10*3/uL (ref 1.7–7.7)
NEUTROS PCT: 57 %
Platelets: 205 10*3/uL (ref 150–400)
RBC: 5.05 MIL/uL (ref 3.87–5.11)
RDW: 13 % (ref 11.5–15.5)
WBC: 5.3 10*3/uL (ref 4.0–10.5)

## 2015-11-21 LAB — COMPREHENSIVE METABOLIC PANEL
ALK PHOS: 53 U/L (ref 38–126)
ALT: 15 U/L (ref 14–54)
AST: 21 U/L (ref 15–41)
Albumin: 4.9 g/dL (ref 3.5–5.0)
Anion gap: 6 (ref 5–15)
BILIRUBIN TOTAL: 0.5 mg/dL (ref 0.3–1.2)
BUN: 15 mg/dL (ref 6–20)
CALCIUM: 9.6 mg/dL (ref 8.9–10.3)
CHLORIDE: 105 mmol/L (ref 101–111)
CO2: 26 mmol/L (ref 22–32)
CREATININE: 0.75 mg/dL (ref 0.44–1.00)
Glucose, Bld: 81 mg/dL (ref 65–99)
Potassium: 4.2 mmol/L (ref 3.5–5.1)
Sodium: 137 mmol/L (ref 135–145)
Total Protein: 8 g/dL (ref 6.5–8.1)

## 2015-11-21 LAB — URINALYSIS, ROUTINE W REFLEX MICROSCOPIC
BILIRUBIN URINE: NEGATIVE
Glucose, UA: NEGATIVE mg/dL
Hgb urine dipstick: NEGATIVE
KETONES UR: NEGATIVE mg/dL
LEUKOCYTES UA: NEGATIVE
NITRITE: NEGATIVE
PH: 6 (ref 5.0–8.0)
Protein, ur: NEGATIVE mg/dL
Specific Gravity, Urine: 1.006 (ref 1.005–1.030)

## 2015-11-21 LAB — HCG, SERUM, QUALITATIVE: PREG SERUM: NEGATIVE

## 2015-11-21 LAB — ABO/RH: ABO/RH(D): A POS

## 2015-11-25 ENCOUNTER — Encounter (HOSPITAL_COMMUNITY): Payer: Self-pay | Admitting: Anesthesiology

## 2015-11-25 NOTE — Anesthesia Preprocedure Evaluation (Addendum)
Anesthesia Evaluation  Patient identified by MRN, date of birth, ID band Patient awake    Reviewed: Allergy & Precautions, NPO status , Patient's Chart, lab work & pertinent test results  Airway Mallampati: I       Dental no notable dental hx. (+) Teeth Intact, Caps   Pulmonary neg pulmonary ROS,    Pulmonary exam normal breath sounds clear to auscultation       Cardiovascular negative cardio ROS Normal cardiovascular exam Rhythm:Regular Rate:Normal     Neuro/Psych PSYCHIATRIC DISORDERS Anxiety Depression negative neurological ROS     GI/Hepatic negative GI ROS, Neg liver ROS,   Endo/Other  negative endocrine ROS  Renal/GU negative Renal ROS  negative genitourinary   Musculoskeletal negative musculoskeletal ROS (+)   Abdominal Normal abdominal exam  (+)   Peds  Hematology negative hematology ROS (+)   Anesthesia Other Findings   Reproductive/Obstetrics Left Ovarian Mass Cervical Dysplasia Fibroid uterus                            Anesthesia Physical Anesthesia Plan  ASA: II  Anesthesia Plan: General   Post-op Pain Management:    Induction: Intravenous  Airway Management Planned: Oral ETT  Additional Equipment:   Intra-op Plan:   Post-operative Plan: Extubation in OR  Informed Consent: I have reviewed the patients History and Physical, chart, labs and discussed the procedure including the risks, benefits and alternatives for the proposed anesthesia with the patient or authorized representative who has indicated his/her understanding and acceptance.   Dental advisory given  Plan Discussed with: Anesthesiologist, CRNA and Surgeon  Anesthesia Plan Comments:         Anesthesia Quick Evaluation

## 2015-11-26 ENCOUNTER — Ambulatory Visit (HOSPITAL_COMMUNITY): Payer: BLUE CROSS/BLUE SHIELD | Admitting: Anesthesiology

## 2015-11-26 ENCOUNTER — Ambulatory Visit (HOSPITAL_COMMUNITY)
Admission: RE | Admit: 2015-11-26 | Discharge: 2015-11-27 | Disposition: A | Discharge status: Home / Self Care | Payer: BLUE CROSS/BLUE SHIELD | Source: Ambulatory Visit | Attending: Gynecologic Oncology | Admitting: Gynecologic Oncology

## 2015-11-26 ENCOUNTER — Encounter (HOSPITAL_COMMUNITY): Payer: Self-pay | Admitting: *Deleted

## 2015-11-26 ENCOUNTER — Encounter (HOSPITAL_COMMUNITY)
Admission: RE | Disposition: A | Discharge status: Home / Self Care | Payer: Self-pay | Source: Ambulatory Visit | Attending: Gynecologic Oncology

## 2015-11-26 DIAGNOSIS — N736 Female pelvic peritoneal adhesions (postinfective): Secondary | ICD-10-CM | POA: Diagnosis not present

## 2015-11-26 DIAGNOSIS — Z79899 Other long term (current) drug therapy: Secondary | ICD-10-CM | POA: Diagnosis not present

## 2015-11-26 DIAGNOSIS — N838 Other noninflammatory disorders of ovary, fallopian tube and broad ligament: Secondary | ICD-10-CM | POA: Diagnosis present

## 2015-11-26 DIAGNOSIS — N888 Other specified noninflammatory disorders of cervix uteri: Secondary | ICD-10-CM | POA: Insufficient documentation

## 2015-11-26 DIAGNOSIS — N135 Crossing vessel and stricture of ureter without hydronephrosis: Secondary | ICD-10-CM | POA: Insufficient documentation

## 2015-11-26 DIAGNOSIS — N839 Noninflammatory disorder of ovary, fallopian tube and broad ligament, unspecified: Secondary | ICD-10-CM | POA: Diagnosis not present

## 2015-11-26 DIAGNOSIS — F329 Major depressive disorder, single episode, unspecified: Secondary | ICD-10-CM | POA: Diagnosis not present

## 2015-11-26 DIAGNOSIS — D270 Benign neoplasm of right ovary: Secondary | ICD-10-CM | POA: Diagnosis not present

## 2015-11-26 DIAGNOSIS — E559 Vitamin D deficiency, unspecified: Secondary | ICD-10-CM | POA: Diagnosis not present

## 2015-11-26 DIAGNOSIS — F419 Anxiety disorder, unspecified: Secondary | ICD-10-CM | POA: Diagnosis not present

## 2015-11-26 DIAGNOSIS — D251 Intramural leiomyoma of uterus: Secondary | ICD-10-CM | POA: Diagnosis not present

## 2015-11-26 DIAGNOSIS — Z7951 Long term (current) use of inhaled steroids: Secondary | ICD-10-CM | POA: Insufficient documentation

## 2015-11-26 DIAGNOSIS — N83202 Unspecified ovarian cyst, left side: Secondary | ICD-10-CM

## 2015-11-26 DIAGNOSIS — D252 Subserosal leiomyoma of uterus: Secondary | ICD-10-CM | POA: Diagnosis not present

## 2015-11-26 DIAGNOSIS — D271 Benign neoplasm of left ovary: Secondary | ICD-10-CM | POA: Diagnosis not present

## 2015-11-26 DIAGNOSIS — D259 Leiomyoma of uterus, unspecified: Secondary | ICD-10-CM | POA: Diagnosis present

## 2015-11-26 HISTORY — PX: ROBOTIC ASSISTED TOTAL HYSTERECTOMY WITH BILATERAL SALPINGO OOPHERECTOMY: SHX6086

## 2015-11-26 LAB — TYPE AND SCREEN
ABO/RH(D): A POS
ANTIBODY SCREEN: NEGATIVE

## 2015-11-26 SURGERY — HYSTERECTOMY, TOTAL, ROBOT-ASSISTED, LAPAROSCOPIC, WITH BILATERAL SALPINGO-OOPHORECTOMY
Anesthesia: General | Laterality: Bilateral

## 2015-11-26 MED ORDER — METOCLOPRAMIDE HCL 5 MG/ML IJ SOLN
INTRAMUSCULAR | Status: AC
  Filled 2015-11-26: qty 2

## 2015-11-26 MED ORDER — ENOXAPARIN SODIUM 40 MG/0.4ML ~~LOC~~ SOLN
40.0000 mg | SUBCUTANEOUS | Status: AC
  Administered 2015-11-26: 40 mg via SUBCUTANEOUS
  Filled 2015-11-26: qty 0.4

## 2015-11-26 MED ORDER — DICLOFENAC SODIUM 50 MG PO TBEC
50.0000 mg | DELAYED_RELEASE_TABLET | Freq: Three times a day (TID) | ORAL | Status: DC
  Administered 2015-11-26 – 2015-11-27 (×3): 50 mg via ORAL
  Filled 2015-11-26 (×4): qty 1

## 2015-11-26 MED ORDER — LIDOCAINE HCL (CARDIAC) 20 MG/ML IV SOLN
INTRAVENOUS | Status: AC
  Filled 2015-11-26: qty 5

## 2015-11-26 MED ORDER — SUGAMMADEX SODIUM 200 MG/2ML IV SOLN
INTRAVENOUS | Status: DC | PRN
  Administered 2015-11-26: 200 mg via INTRAVENOUS

## 2015-11-26 MED ORDER — STERILE WATER FOR IRRIGATION IR SOLN
Status: DC | PRN
  Administered 2015-11-26: 1000 mL

## 2015-11-26 MED ORDER — FENTANYL CITRATE (PF) 250 MCG/5ML IJ SOLN
INTRAMUSCULAR | Status: DC | PRN
  Administered 2015-11-26: 25 ug via INTRAVENOUS
  Administered 2015-11-26: 50 ug via INTRAVENOUS
  Administered 2015-11-26: 25 ug via INTRAVENOUS
  Administered 2015-11-26: 50 ug via INTRAVENOUS
  Administered 2015-11-26: 100 ug via INTRAVENOUS

## 2015-11-26 MED ORDER — ACETAMINOPHEN 500 MG PO TABS
1000.0000 mg | ORAL_TABLET | Freq: Four times a day (QID) | ORAL | Status: DC
  Administered 2015-11-26 – 2015-11-27 (×4): 1000 mg via ORAL
  Filled 2015-11-26 (×6): qty 2

## 2015-11-26 MED ORDER — FLUTICASONE PROPIONATE 50 MCG/ACT NA SUSP
2.0000 | Freq: Every day | NASAL | Status: DC | PRN
  Filled 2015-11-26: qty 16

## 2015-11-26 MED ORDER — LIDOCAINE HCL (CARDIAC) 20 MG/ML IV SOLN
INTRAVENOUS | Status: DC | PRN
  Administered 2015-11-26: 50 mg via INTRATRACHEAL

## 2015-11-26 MED ORDER — LACTATED RINGERS IV SOLN
INTRAVENOUS | Status: DC | PRN
  Administered 2015-11-26: 1000 mL

## 2015-11-26 MED ORDER — ONDANSETRON HCL 4 MG PO TABS: 4.0000 mg | ORAL_TABLET | Freq: Four times a day (QID) | ORAL | Status: DC | PRN

## 2015-11-26 MED ORDER — SCOPOLAMINE 1 MG/3DAYS TD PT72
1.0000 | MEDICATED_PATCH | Freq: Once | TRANSDERMAL | Status: AC
  Administered 2015-11-26: 1 via TRANSDERMAL

## 2015-11-26 MED ORDER — GABAPENTIN 300 MG PO CAPS
600.0000 mg | ORAL_CAPSULE | Freq: Every day | ORAL | Status: AC
  Administered 2015-11-26: 600 mg via ORAL
  Filled 2015-11-26: qty 2

## 2015-11-26 MED ORDER — SCOPOLAMINE 1 MG/3DAYS TD PT72
MEDICATED_PATCH | TRANSDERMAL | Status: AC
  Filled 2015-11-26: qty 1

## 2015-11-26 MED ORDER — PROPOFOL 10 MG/ML IV BOLUS
INTRAVENOUS | Status: AC
  Filled 2015-11-26: qty 20

## 2015-11-26 MED ORDER — FENTANYL CITRATE (PF) 100 MCG/2ML IJ SOLN
25.0000 ug | INTRAMUSCULAR | Status: DC | PRN
  Administered 2015-11-26: 25 ug via INTRAVENOUS
  Administered 2015-11-26: 50 ug via INTRAVENOUS
  Administered 2015-11-26: 25 ug via INTRAVENOUS

## 2015-11-26 MED ORDER — FENTANYL CITRATE (PF) 250 MCG/5ML IJ SOLN
INTRAMUSCULAR | Status: AC
  Filled 2015-11-26: qty 5

## 2015-11-26 MED ORDER — ONDANSETRON HCL 4 MG/2ML IJ SOLN
4.0000 mg | Freq: Four times a day (QID) | INTRAMUSCULAR | Status: DC | PRN
  Administered 2015-11-26: 4 mg via INTRAVENOUS
  Filled 2015-11-26: qty 2

## 2015-11-26 MED ORDER — CETYLPYRIDINIUM CHLORIDE 0.05 % MT LIQD
7.0000 mL | Freq: Two times a day (BID) | OROMUCOSAL | Status: DC
  Administered 2015-11-26 – 2015-11-27 (×3): 7 mL via OROMUCOSAL

## 2015-11-26 MED ORDER — ALPRAZOLAM 0.25 MG PO TABS: 0.2500 mg | ORAL_TABLET | Freq: Every evening | ORAL | Status: DC | PRN

## 2015-11-26 MED ORDER — FENTANYL CITRATE (PF) 100 MCG/2ML IJ SOLN
INTRAMUSCULAR | Status: AC
  Filled 2015-11-26: qty 2

## 2015-11-26 MED ORDER — ACETAMINOPHEN 10 MG/ML IV SOLN
INTRAVENOUS | Status: AC
  Filled 2015-11-26: qty 100

## 2015-11-26 MED ORDER — METOCLOPRAMIDE HCL 5 MG/ML IJ SOLN
10.0000 mg | Freq: Once | INTRAMUSCULAR | Status: AC | PRN
  Administered 2015-11-26: 10 mg via INTRAVENOUS

## 2015-11-26 MED ORDER — MIDAZOLAM HCL 5 MG/5ML IJ SOLN
INTRAMUSCULAR | Status: DC | PRN
  Administered 2015-11-26: 2 mg via INTRAVENOUS

## 2015-11-26 MED ORDER — CEFAZOLIN SODIUM-DEXTROSE 2-4 GM/100ML-% IV SOLN
INTRAVENOUS | Status: AC
  Filled 2015-11-26: qty 100

## 2015-11-26 MED ORDER — PROPOFOL 10 MG/ML IV BOLUS
INTRAVENOUS | Status: DC | PRN
  Administered 2015-11-26: 150 mg via INTRAVENOUS

## 2015-11-26 MED ORDER — MIDAZOLAM HCL 2 MG/2ML IJ SOLN
INTRAMUSCULAR | Status: AC
  Filled 2015-11-26: qty 2

## 2015-11-26 MED ORDER — DEXAMETHASONE SODIUM PHOSPHATE 10 MG/ML IJ SOLN
INTRAMUSCULAR | Status: DC | PRN
  Administered 2015-11-26: 10 mg via INTRAVENOUS

## 2015-11-26 MED ORDER — ONDANSETRON HCL 4 MG/2ML IJ SOLN
INTRAMUSCULAR | Status: DC | PRN
  Administered 2015-11-26: 4 mg via INTRAVENOUS

## 2015-11-26 MED ORDER — OXYCODONE HCL 5 MG PO TABS: 5.0000 mg | ORAL_TABLET | Freq: Once | ORAL | Status: DC | PRN

## 2015-11-26 MED ORDER — KCL IN DEXTROSE-NACL 20-5-0.45 MEQ/L-%-% IV SOLN
INTRAVENOUS | Status: DC
  Administered 2015-11-26: 13:00:00 via INTRAVENOUS
  Filled 2015-11-26 (×2): qty 1000

## 2015-11-26 MED ORDER — ROCURONIUM BROMIDE 100 MG/10ML IV SOLN
INTRAVENOUS | Status: AC
  Filled 2015-11-26: qty 1

## 2015-11-26 MED ORDER — MEPERIDINE HCL 50 MG/ML IJ SOLN: 6.2500 mg | INTRAMUSCULAR | Status: DC | PRN

## 2015-11-26 MED ORDER — ACETAMINOPHEN 10 MG/ML IV SOLN
1000.0000 mg | Freq: Once | INTRAVENOUS | Status: AC
  Administered 2015-11-26: 1000 mg via INTRAVENOUS

## 2015-11-26 MED ORDER — ROCURONIUM BROMIDE 100 MG/10ML IV SOLN
INTRAVENOUS | Status: DC | PRN
  Administered 2015-11-26 (×3): 10 mg via INTRAVENOUS
  Administered 2015-11-26: 50 mg via INTRAVENOUS

## 2015-11-26 MED ORDER — HYDROMORPHONE HCL 1 MG/ML IJ SOLN: 0.2000 mg | INTRAMUSCULAR | Status: DC | PRN

## 2015-11-26 MED ORDER — SUGAMMADEX SODIUM 200 MG/2ML IV SOLN
INTRAVENOUS | Status: AC
  Filled 2015-11-26: qty 2

## 2015-11-26 MED ORDER — ONDANSETRON HCL 4 MG/2ML IJ SOLN
INTRAMUSCULAR | Status: AC
  Filled 2015-11-26: qty 2

## 2015-11-26 MED ORDER — TRAMADOL HCL 50 MG PO TABS: 100.0000 mg | ORAL_TABLET | Freq: Four times a day (QID) | ORAL | Status: DC | PRN

## 2015-11-26 MED ORDER — OXYCODONE HCL 5 MG/5ML PO SOLN: 5.0000 mg | Freq: Once | ORAL | Status: DC | PRN

## 2015-11-26 MED ORDER — LACTATED RINGERS IV SOLN
INTRAVENOUS | Status: DC | PRN
  Administered 2015-11-26 (×2): via INTRAVENOUS

## 2015-11-26 MED ORDER — CITALOPRAM HYDROBROMIDE 20 MG PO TABS
20.0000 mg | ORAL_TABLET | Freq: Every day | ORAL | Status: DC
Start: 2015-11-27 — End: 2015-11-27
  Administered 2015-11-27: 20 mg via ORAL
  Filled 2015-11-26: qty 1

## 2015-11-26 MED ORDER — DEXAMETHASONE SODIUM PHOSPHATE 10 MG/ML IJ SOLN
INTRAMUSCULAR | Status: AC
  Filled 2015-11-26: qty 1

## 2015-11-26 MED ORDER — CEFAZOLIN SODIUM-DEXTROSE 2-4 GM/100ML-% IV SOLN
2.0000 g | INTRAVENOUS | Status: AC
  Administered 2015-11-26: 2 g via INTRAVENOUS
  Filled 2015-11-26: qty 100

## 2015-11-26 SURGICAL SUPPLY — 58 items
APL ESCP 34 STRL LF DISP (HEMOSTASIS)
APPLICATOR SURGIFLO ENDO (HEMOSTASIS) IMPLANT
BAG LAPAROSCOPIC 12 15 PORT 16 (BASKET) ×1 IMPLANT
BAG RETRIEVAL 12/15 (BASKET) ×2
BAG SPEC RTRVL LRG 6X4 10 (ENDOMECHANICALS) ×1
CHLORAPREP W/TINT 26ML (MISCELLANEOUS) ×2 IMPLANT
COVER SURGICAL LIGHT HANDLE (MISCELLANEOUS) ×2 IMPLANT
COVER TIP SHEARS 8 DVNC (MISCELLANEOUS) ×1 IMPLANT
COVER TIP SHEARS 8MM DA VINCI (MISCELLANEOUS) ×1
DRAPE ARM DVNC X/XI (DISPOSABLE) ×4 IMPLANT
DRAPE COLUMN DVNC XI (DISPOSABLE) ×1 IMPLANT
DRAPE DA VINCI XI ARM (DISPOSABLE) ×4
DRAPE DA VINCI XI COLUMN (DISPOSABLE) ×1
DRAPE SHEET LG 3/4 BI-LAMINATE (DRAPES) ×4 IMPLANT
DRAPE SURG IRRIG POUCH 19X23 (DRAPES) ×2 IMPLANT
ELECT REM PT RETURN 15FT ADLT (MISCELLANEOUS) ×2 IMPLANT
GLOVE BIO SURGEON STRL SZ 6 (GLOVE) ×8 IMPLANT
GLOVE BIO SURGEON STRL SZ 6.5 (GLOVE) ×4 IMPLANT
GOWN STRL REUS W/ TWL LRG LVL3 (GOWN DISPOSABLE) ×2 IMPLANT
GOWN STRL REUS W/TWL LRG LVL3 (GOWN DISPOSABLE) ×2
HOLDER FOLEY CATH W/STRAP (MISCELLANEOUS) ×2 IMPLANT
IRRIG SUCT STRYKERFLOW 2 WTIP (MISCELLANEOUS) ×2
IRRIGATION SUCT STRKRFLW 2 WTP (MISCELLANEOUS) ×1 IMPLANT
KIT BASIN OR (CUSTOM PROCEDURE TRAY) ×2 IMPLANT
KIT PROCEDURE DA VINCI SI (MISCELLANEOUS)
KIT PROCEDURE DVNC SI (MISCELLANEOUS) IMPLANT
LIQUID BAND (GAUZE/BANDAGES/DRESSINGS) ×2 IMPLANT
MANIPULATOR UTERINE 4.5 ZUMI (MISCELLANEOUS) ×2 IMPLANT
MARKER SKIN DUAL TIP RULER LAB (MISCELLANEOUS) ×2 IMPLANT
NDL SAFETY ECLIPSE 18X1.5 (NEEDLE) ×1 IMPLANT
NEEDLE HYPO 18GX1.5 SHARP (NEEDLE) ×2
NEEDLE SPNL 18GX3.5 QUINCKE PK (NEEDLE) ×2 IMPLANT
OBTURATOR XI 8MM BLADELESS (TROCAR) ×2 IMPLANT
OCCLUDER COLPOPNEUMO (BALLOONS) ×2 IMPLANT
PAD POSITIONING PINK XL (MISCELLANEOUS) ×2 IMPLANT
PORT ACCESS TROCAR AIRSEAL 12 (TROCAR) ×1 IMPLANT
PORT ACCESS TROCAR AIRSEAL 5M (TROCAR) ×1
POUCH SPECIMEN RETRIEVAL 10MM (ENDOMECHANICALS) ×2 IMPLANT
SEAL CANN UNIV 5-8 DVNC XI (MISCELLANEOUS) ×4 IMPLANT
SEAL XI 5MM-8MM UNIVERSAL (MISCELLANEOUS) ×4
SET TRI-LUMEN FLTR TB AIRSEAL (TUBING) ×2 IMPLANT
SHEET LAVH (DRAPES) ×2 IMPLANT
SOLUTION ELECTROLUBE (MISCELLANEOUS) ×2 IMPLANT
SURGIFLO W/THROMBIN 8M KIT (HEMOSTASIS) IMPLANT
SUT MNCRL AB 4-0 PS2 18 (SUTURE) ×4 IMPLANT
SUT VIC AB 0 CT1 27 (SUTURE) ×6
SUT VIC AB 0 CT1 27XBRD ANTBC (SUTURE) ×3 IMPLANT
SYR 50ML LL SCALE MARK (SYRINGE) ×2 IMPLANT
SYRINGE 10CC LL (SYRINGE) ×2 IMPLANT
TOWEL OR 17X26 10 PK STRL BLUE (TOWEL DISPOSABLE) ×4 IMPLANT
TOWEL OR NON WOVEN STRL DISP B (DISPOSABLE) ×2 IMPLANT
TRAP SPECIMEN MUCOUS 40CC (MISCELLANEOUS) IMPLANT
TRAY FOLEY W/METER SILVER 14FR (SET/KITS/TRAYS/PACK) ×2 IMPLANT
TRAY LAPAROSCOPIC (CUSTOM PROCEDURE TRAY) ×2 IMPLANT
TROCAR BLADELESS OPT 5 100 (ENDOMECHANICALS) ×2 IMPLANT
UNDERPAD 30X30 (UNDERPADS AND DIAPERS) ×2 IMPLANT
UNDERPAD 30X30 INCONTINENT (UNDERPADS AND DIAPERS) ×2 IMPLANT
WATER STERILE IRR 1500ML POUR (IV SOLUTION) ×4 IMPLANT

## 2015-11-26 NOTE — Op Note (Signed)
OPERATIVE NOTE 11/26/15  Surgeon: Donaciano Eva   Assistants: Dr Lahoma Crocker (an MD assistant was necessary for tissue manipulation, management of robotic instrumentation, retraction and positioning due to the complexity of the case and hospital policies).   Anesthesia: General endotracheal anesthesia  ASA Class: 3   Pre-operative Diagnosis: symptomatic uterine fibroids, left ovarian cyst  Post-operative Diagnosis: same, uterine fibroids (subserosal and intramural) with left ovarian cyst  Operation: Robotic-assisted laparoscopic total hysterectomy >250gm with bilateral salpingectomy and left oophorectomy, right ureterolysis.  Surgeon: Donaciano Eva  Assistant Surgeon: Lahoma Crocker MD  Anesthesia: GET  Urine Output: 100  Operative Findings:  : 16cm uterus with multiple irregular subserosal fibroids, left lower uterine segment uterosacral ligament fibroid, retroperitoneal fibrosis causing adhesions between the ovaries and ovarian fossae bilaterally. Unexplained adhesions between bladder and lower uterine segment (possibly prior endometriosis). 2-3cm left ovarian cyst (benign on frozen section).   Estimated Blood Loss:  less than 50 mL      Total IV Fluids: 700 ml         Specimens: washings, right ovarian mass, left ovary and tube, uterus and cervix and right fallopian tube         Complications:  None; patient tolerated the procedure well.         Disposition: PACU - hemodynamically stable.  Procedure Details  The patient was seen in the Holding Room. The risks, benefits, complications, treatment options, and expected outcomes were discussed with the patient.  The patient concurred with the proposed plan, giving informed consent.  The site of surgery properly noted/marked. The patient was identified as Sharon Conrad and the procedure verified as a Robotic-assisted hysterectomy with bilateral salpingo oophorectomy. A Time Out was held and the above  information confirmed.  After induction of anesthesia, the patient was draped and prepped in the usual sterile manner. Pt was placed in supine position after anesthesia and draped and prepped in the usual sterile manner. The abdominal drape was placed after the CholoraPrep had been allowed to dry for 3 minutes.  Her arms were tucked to her side with all appropriate precautions.  The shoulders were stabilized with padded shoulder blocks applied to the acromium processes.  The patient was placed in the semi-lithotomy position in [place].  The perineum was prepped with Betadine. The patient was then prepped. Foley catheter was placed.  A sterile speculum was placed in the vagina.  The cervix was grasped with a single-tooth tenaculum and dilated with Kennon Rounds dilators.  The ZUMI uterine manipulator with a medium colpotomizer ring was placed without difficulty.  A pneum occluder balloon was placed over the manipulator.  OG tube placement was confirmed and to suction.   Next, a 5 mm skin incision was made 1 cm below the subcostal margin in the midclavicular line.  The 5 mm Optiview port and scope was used for direct entry.  Opening pressure was under 10 mm CO2.  The abdomen was insufflated and the findings were noted as above.   At this point and all points during the procedure, the patient's intra-abdominal pressure did not exceed 15 mmHg. Next, a 10 mm skin incision was made in the umbilicus and a right and left port was placed about 10 cm lateral to the robot port on the right and left side.  A fourth arm was placed in the left lower quadrant 2 cm above and superior and medial to the anterior superior iliac spine.  All ports were placed under direct visualization.  The patient was placed in steep Trendelenburg.  Bowel was folded away into the upper abdomen.  The robot was docked in the normal manner.  Sharp dissection was employed to take down adhesions between the cecum and right pelvic side wall. A 1cm mass  was appreciated on the right ovary. It was sharply dissected free and sent for frozen section which was benign. The left pelvic side wall was entered with monopolar energy by transecting the round ligament. The retroperitoneal space was opened on the left and the left ureter was identified in the retroperitoneum at the level of the pelvic brim. A window was created in the broad ligament above the ureter which skeletonized the left ovarian vessels which were bipolar sealed and transected. The left tube was separated from the uterine fundus and the utero-ovarian ligament on the left was transected with monopolar to connect with the broad ligament window above the left ureter. The left tube and ovary which were then freed. They were placed in an endocatch bag and later delivered through the colpotomy and sent for frozen section which was benign.  The hysterectomy was started after the round ligament on the right side was incised and the retroperitoneum was entered and the pararectal space was developed.  The ureter was noted to be on the medial leaf of the broad ligament.  There was substantial right retroperitoneal fibrosis, possibly from endometriosis. This scarred the right ureter and ovary to the side wall. The right ureter was skeletonized and dissected free from the broad ligament. The peritoneum above the ureter was incised and stretched.  The posterior peritoneum was taken down to the level of the KOH ring.  The right fallopian tube was separated from the right ovary. The right utero-ovarian ligament was bipolar sealed and transected. Sharp adhesiolysis freed the right ovary from the ovarian fossa.The anterior peritoneum was also taken down with meticulous sharp dissection to identify the contour of the koh cup.  The bladder flap was created to the level of the KOH ring.  The uterine artery on the right side was skeletonized, cauterized and cut in the normal manner.  A similar procedure was performed on the  left with skeletonization of the left uterine vessels at the uterine isthmus after mobilization of the left ureter. The left uterine vessels were sealed at the level of the koh cup and were transected.  The colpotomy was made and the uterus, cervix, right fallopian tube were amputated and placed in an endocatch bag which was delivered to the vagina. The surgeon and assistant then morcellated the uterine specimen while contained within the endocatch bag. This specimen was sent for frozen section which was benign.  Pedicles were inspected and excellent hemostasis was achieved.    The colpotomy at the vaginal cuff was closed with Vicryl on a CT1 needle in interrupted figure of 8 sutures.  Irrigation was used and excellent hemostasis was achieved.  At this point in the procedure was completed.  Robotic instruments were removed under direct visulaization.  The robot was undocked. The 10 mm ports were closed with Vicryl on a UR-5 needle and the fascia was closed with 0 Vicryl on a UR-5 needle.  The skin was closed with 4-0 Vicryl in a subcuticular manner.  Dermabond was applied.  Sponge, lap and needle counts correct x 2.  The patient was taken to the recovery room in stable condition.  The vagina was swabbed with  minimal bleeding noted.   All instrument and needle counts were correct  x  3.   The patient was transferred to the recovery room in a stable condition.  Donaciano Eva, MD

## 2015-11-26 NOTE — Anesthesia Postprocedure Evaluation (Signed)
Anesthesia Post Note  Patient: Lujain D [place]  Procedure(s) Performed: Procedure(s) (LRB): XI ROBOTIC ASSISTED TOTAL HYSTERECTOMY WITH UTERUS GREASTER THAN 250 GRAMS; RIGHT OVARIAN CYST AND RIGHT SALPINGECTOMEY; LEFT SALPINGO-OOPHERECTOMY (Bilateral)  Patient location during evaluation: PACU Anesthesia Type: General Level of consciousness: awake and alert and oriented Pain management: pain level controlled Vital Signs Assessment: post-procedure vital signs reviewed and stable Respiratory status: spontaneous breathing, nonlabored ventilation and respiratory function stable Cardiovascular status: blood pressure returned to baseline and stable Postop Assessment: no signs of nausea or vomiting Anesthetic complications: no    Last Vitals:  Vitals:   11/26/15 1015 11/26/15 1030  BP: (!) 147/85 (!) 170/89  Pulse: 71 62  Resp: 10 10  Temp:      Last Pain:  Vitals:   11/26/15 1035  TempSrc:   PainSc: Asleep                 Aurianna Earlywine A.

## 2015-11-26 NOTE — Interval H&P Note (Signed)
History and Physical Interval Note:  11/26/2015 7:13 AM  Sharon Conrad  has presented today for surgery, with the diagnosis of LEFT OVARIAN MASS  The various methods of treatment have been discussed with the patient and family. After consideration of risks, benefits and other options for treatment, the patient has consented to  Procedure(s): XI ROBOTIC ASSISTED TOTAL HYSTERECTOMY WITH LEFT SALPINGO OOPHORECTOMY POSSIBLE STAGING (Bilateral) as a surgical intervention .  The patient's history has been reviewed, patient examined, no change in status, stable for surgery.  I have reviewed the patient's chart and labs.  Questions were answered to the patient's satisfaction.     Donaciano Eva

## 2015-11-26 NOTE — Transfer of Care (Signed)
Immediate Anesthesia Transfer of Care Note  Patient: Sharon Conrad  Procedure(s) Performed: Procedure(s): XI ROBOTIC ASSISTED TOTAL HYSTERECTOMY WITH UTERUS GREASTER THAN 250 GRAMS; RIGHT OVARIAN CYST AND RIGHT SALPINGECTOMEY; LEFT SALPINGO-OOPHERECTOMY (Bilateral)  Patient Location: PACU  Anesthesia Type:General  Level of Consciousness: awake, alert , oriented and patient cooperative  Airway & Oxygen Therapy: Patient Spontanous Breathing and Patient connected to face mask oxygen  Post-op Assessment: Report given to RN and Post -op Vital signs reviewed and stable  Post vital signs: Reviewed and stable  Last Vitals:  Vitals:   11/26/15 0541  BP: (!) 159/85  Pulse: 89  Resp: 16  Temp: 36.8 C    Last Pain:  Vitals:   11/26/15 0541  TempSrc: Oral         Complications: No apparent anesthesia complications

## 2015-11-26 NOTE — Anesthesia Procedure Notes (Signed)
Procedure Name: Intubation Date/Time: 11/26/2015 7:32 AM Performed by: Dione Booze Pre-anesthesia Checklist: Patient identified, Emergency Drugs available, Suction available and Patient being monitored Patient Re-evaluated:Patient Re-evaluated prior to inductionOxygen Delivery Method: Circle system utilized Preoxygenation: Pre-oxygenation with 100% oxygen Intubation Type: IV induction Ventilation: Mask ventilation without difficulty Laryngoscope Size: Mac and 4 Grade View: Grade II Tube type: Oral Tube size: 7.5 mm Number of attempts: 1 Airway Equipment and Method: Stylet Placement Confirmation: ETT inserted through vocal cords under direct vision Secured at: 21 cm Tube secured with: Tape Dental Injury: Teeth and Oropharynx as per pre-operative assessment  Comments: Small bruise to upper lip.

## 2015-11-26 NOTE — H&P (View-Only) (Signed)
Consult Note: Gyn-Onc  Consult was requested by Dr. Toney Rakes for the evaluation of Sharon Conrad 50 y.o. female  CC:  Chief Complaint  Patient presents with  . Left ovarian mass , fibroids    New Consultation    Assessment/Plan:  Sharon Conrad  is a 50 y.o.  year old with a left ovarian cyst measuring 4.6cm and a symptomatic fibroid uterus. Her tumor markers are normal.  She is electing for definitive surgery with hysterectomy, LSO.   I discussed with the patient that an alternative to surgery would be expectant management with repeat US and tumor marker assessment. However, the patient would prefer for definitive management and has bulk symptoms from her fibroids.  I think she is a good candidate for a minimally invasive approach with robotically assisted total hysterectomy (uterus >250gm) and LSO. We will perform frozen section at that time. If malignancy is identified, staging will be performed. I will perform contained vaginal morcellation for specimen delivery, or minlaparotomy.  I discussed operative risks including  bleeding, infection, damage to internal organs (such as bladder,ureters, bowels), blood clot, reoperation and rehospitalization. I discussed that hysterectomy results in permanent infertility.   HPI: Sharon Conrad is a 50 year old G0 who is seen in consultation at the request of Dr Toney Rakes for a left ovarian complex cyst and symptomatic uterine fibroids. The patient has a long-standing history of lower abdominal pelvic pressure and is known to have a history of a fibroid uterus. An ultrasound in September 2015 measured here is 10.2 cm in greatest dimension with an endometrial stripe of 4.2 mm. The left ovary was normal at time and the right ovary contained a follicular cyst. An IUD was placed in the uterus to control symptoms of menorrhagia and pain and did so however she continued to have symptoms of pelvic pressure. A repeat ultrasound was performed  on 10/23/2015 by Dr. Toney Rakes. It was performed because of her symptoms of persistent lower abdominal pelvic pressure. The uterus on that repeat exam and showing some growth in size and now measured 14.5 x 12.9 x 7.7 cm with an endometrial stripe of 23.7 mm. An IUD was seen. Patient had several intramural fibroids identified. The right ovary was normal. The left ovary contained a thin walled multiseptated cystic and solid mass measuring 4.6 x 3.9 x 4.4 cm with no color-flow seen. She had tumor markers assessed on 10/31/2015 and these were normal including a normal pre-and postmenopausal ROMA score, and normal CA-125 at 18 and the normal 8443.  She is thin and otherwise very healthy. She has a history of a prior open appendectomy at age 18.   Current Meds:  Outpatient Encounter Prescriptions as of 11/08/2015  Medication Sig  . ALPRAZolam (XANAX) 0.25 MG tablet Take 1 tablet (0.25 mg total) by mouth at bedtime as needed for anxiety.  Marland Kitchen BEE POLLEN PO Take by mouth.  . Bilberry, Vaccinium myrtillus, (BILBERRY EXTRACT PO) Take by mouth.  . Cholecalciferol (VITAMIN D) 2000 UNITS tablet Take 2,000 Units by mouth daily.  . citalopram (CELEXA) 20 MG tablet Take 1 tablet (20 mg total) by mouth daily.  . fluticasone (FLONASE) 50 MCG/ACT nasal spray Place 2 sprays into the nose daily.  Marland Kitchen levonorgestrel (MIRENA) 20 MCG/24HR IUD 1 each by Intrauterine route once.  . Vitamin D, Ergocalciferol, (DRISDOL) 50000 units CAPS capsule Take 1 capsule (50,000 Units total) by mouth every 7 (seven) days.  . [DISCONTINUED] spironolactone (ALDACTONE) 50 MG tablet TAKE 1 TABLET (50  MG TOTAL) BY MOUTH DAILY. (Patient not taking: Reported on 10/21/2015)  . [DISCONTINUED] Vitamin D, Ergocalciferol, (DRISDOL) 50000 UNITS CAPS capsule Take 1 capsule (50,000 Units total) by mouth every 7 (seven) days. (Patient not taking: Reported on 10/21/2015)   No facility-administered encounter medications on file as of 11/08/2015.    Allergy:   Allergies  Allergen Reactions  . Codeine Nausea And Vomiting    Nausea and vomitin  . Shellfish Allergy Hives    Social Hx:   Social History   Social History  . Marital Status: Married    Spouse Name: N/A  . Number of Children: N/A  . Years of Education: N/A   Occupational History  . Not on file.   Social History Main Topics  . Smoking status: Never Smoker   . Smokeless tobacco: Never Used  . Alcohol Use: 1.2 oz/week    2 Glasses of wine per week     Comment: couple times a week  . Drug Use: No  . Sexual Activity:    Partners: Male    Birth Control/ Protection: IUD   Other Topics Concern  . Not on file   Social History Narrative    Past Surgical Hx:  Past Surgical History  Procedure Laterality Date  . Leep  1992  . Appendectomy      AGE 50  . Intrauterine device insertion  6/12    Mirena    Past Medical Hx:  Past Medical History  Diagnosis Date  . Cervical dysplasia     CIN - 1--LEEP 1992  . Fibroid   . Vitamin D deficiency   . Environmental allergies   . Allergy     Past Gynecological History:  G0  No LMP recorded. Patient is not currently having periods (Reason: IUD).  Family Hx:  Family History  Problem Relation Age of Onset  . Hypertension Mother   . Cancer Mother     COLON CANCER 60'S  . Colon polyps Mother   . Cancer Father     lung  . Colon cancer Neg Hx     Review of Systems:  Constitutional  Feels well,    ENT Normal appearing ears and nares bilaterally Skin/Breast  No rash, sores, jaundice, itching, dryness Cardiovascular  No chest pain, shortness of breath, or edema  Pulmonary  No cough or wheeze.  Gastro Intestinal  No nausea, vomitting, or diarrhoea. No bright red blood per rectum, no abdominal pain, change in bowel movement, or constipation.  Genito Urinary  No frequency, urgency, dysuria, + pelvic pressure Musculo Skeletal  No myalgia, arthralgia, joint swelling or pain  Neurologic  No weakness, numbness,  change in gait,  Psychology  No depression, anxiety, insomnia.   Vitals:  Blood pressure 147/90, pulse 79, temperature 98.1 F (36.7 C), temperature source Oral, resp. rate 18, height 5\' 5"  (1.651 m), weight 141 lb (63.957 kg), SpO2 99 %.  Physical Exam: WD in NAD Neck  Supple NROM, without any enlargements.  Lymph Node Survey No cervical supraclavicular or inguinal adenopathy Cardiovascular  Pulse normal rate, regularity and rhythm. S1 and S2 normal.  Lungs  Clear to auscultation bilateraly, without wheezes/crackles/rhonchi. Good air movement.  Skin  No rash/lesions/breakdown  Psychiatry  Alert and oriented to person, place, and time  Abdomen  Normoactive bowel sounds, abdomen soft, non-tender and thin without evidence of hernia.  Back No CVA tenderness Genito Urinary  Vulva/vagina: Normal external female genitalia.  No lesions. No discharge or bleeding.  Bladder/urethra:  No lesions  or masses, well supported bladder  Vagina: normal  Cervix: Normal appearing, no lesions.  Uterus: bulky, 14cm size, mobile, no parametrial involvement or nodularity.  Adnexa: no palpable masses. Rectal  Good tone, no masses no cul de sac nodularity.  Extremities  No bilateral cyanosis, clubbing or edema.   Donaciano Eva, MD  11/08/2015, 3:47 PM

## 2015-11-27 DIAGNOSIS — N135 Crossing vessel and stricture of ureter without hydronephrosis: Secondary | ICD-10-CM | POA: Diagnosis not present

## 2015-11-27 DIAGNOSIS — D252 Subserosal leiomyoma of uterus: Secondary | ICD-10-CM | POA: Diagnosis not present

## 2015-11-27 DIAGNOSIS — D251 Intramural leiomyoma of uterus: Secondary | ICD-10-CM | POA: Diagnosis not present

## 2015-11-27 DIAGNOSIS — N83202 Unspecified ovarian cyst, left side: Secondary | ICD-10-CM | POA: Diagnosis not present

## 2015-11-27 DIAGNOSIS — Z79899 Other long term (current) drug therapy: Secondary | ICD-10-CM | POA: Diagnosis not present

## 2015-11-27 DIAGNOSIS — E559 Vitamin D deficiency, unspecified: Secondary | ICD-10-CM | POA: Diagnosis not present

## 2015-11-27 DIAGNOSIS — F419 Anxiety disorder, unspecified: Secondary | ICD-10-CM | POA: Diagnosis not present

## 2015-11-27 DIAGNOSIS — N736 Female pelvic peritoneal adhesions (postinfective): Secondary | ICD-10-CM | POA: Diagnosis not present

## 2015-11-27 DIAGNOSIS — F329 Major depressive disorder, single episode, unspecified: Secondary | ICD-10-CM | POA: Diagnosis not present

## 2015-11-27 DIAGNOSIS — D270 Benign neoplasm of right ovary: Secondary | ICD-10-CM | POA: Diagnosis not present

## 2015-11-27 DIAGNOSIS — N888 Other specified noninflammatory disorders of cervix uteri: Secondary | ICD-10-CM | POA: Diagnosis not present

## 2015-11-27 DIAGNOSIS — N839 Noninflammatory disorder of ovary, fallopian tube and broad ligament, unspecified: Secondary | ICD-10-CM | POA: Diagnosis not present

## 2015-11-27 DIAGNOSIS — Z7951 Long term (current) use of inhaled steroids: Secondary | ICD-10-CM | POA: Diagnosis not present

## 2015-11-27 LAB — CBC
HCT: 32.4 % — ABNORMAL LOW (ref 36.0–46.0)
HEMOGLOBIN: 10.9 g/dL — AB (ref 12.0–15.0)
MCH: 30 pg (ref 26.0–34.0)
MCHC: 33.6 g/dL (ref 30.0–36.0)
MCV: 89.3 fL (ref 78.0–100.0)
PLATELETS: 179 10*3/uL (ref 150–400)
RBC: 3.63 MIL/uL — ABNORMAL LOW (ref 3.87–5.11)
RDW: 13.2 % (ref 11.5–15.5)
WBC: 8.5 10*3/uL (ref 4.0–10.5)

## 2015-11-27 LAB — BASIC METABOLIC PANEL
Anion gap: 4 — ABNORMAL LOW (ref 5–15)
BUN: 10 mg/dL (ref 6–20)
CALCIUM: 8.5 mg/dL — AB (ref 8.9–10.3)
CO2: 28 mmol/L (ref 22–32)
CREATININE: 0.92 mg/dL (ref 0.44–1.00)
Chloride: 106 mmol/L (ref 101–111)
Glucose, Bld: 115 mg/dL — ABNORMAL HIGH (ref 65–99)
Potassium: 3.8 mmol/L (ref 3.5–5.1)
SODIUM: 138 mmol/L (ref 135–145)

## 2015-11-27 MED ORDER — DOCUSATE SODIUM 100 MG PO CAPS: 100.0000 mg | ORAL_CAPSULE | Freq: Two times a day (BID) | ORAL | 0 refills | Status: DC

## 2015-11-27 MED ORDER — OXYCODONE-ACETAMINOPHEN 5-325 MG PO TABS: 1.0000 | ORAL_TABLET | ORAL | 0 refills | Status: DC | PRN

## 2015-11-27 NOTE — Progress Notes (Signed)
Discharge instructions review with patient.  All questions answered and patient expresses understanding.  Will continue to monitor, d/c home with self care.

## 2015-11-27 NOTE — Discharge Summary (Signed)
Physician Discharge Summary  Patient ID: Sharon Conrad MRN: [id number] DOB/AGE: 1965/06/28 50 y.o.  Admit date: 11/26/2015 Discharge date: 11/27/2015  Admission Diagnoses: Ovarian mass, left  Discharge Diagnoses:  Principal Problem:   Ovarian mass, left Active Problems:   Fibroids, intramural   Uterine fibroid   Discharged Condition: good  Hospital Course: hysterectomy, LSO for fibroids and left ovarian cyst on 11/26/15. Uncomplcated surgery with benign frozen section. Uncomplicated postop course.  Consults: None  Significant Diagnostic Studies: labs:  CBC    Component Value Date/Time   WBC 8.5 11/27/2015 0525   RBC 3.63 (L) 11/27/2015 0525   HGB 10.9 (L) 11/27/2015 0525   HCT 32.4 (L) 11/27/2015 0525   PLT 179 11/27/2015 0525   MCV 89.3 11/27/2015 0525   MCH 30.0 11/27/2015 0525   MCHC 33.6 11/27/2015 0525   RDW 13.2 11/27/2015 0525   LYMPHSABS 1.8 11/21/2015 1110   MONOABS 0.3 11/21/2015 1110   EOSABS 0.1 11/21/2015 1110   BASOSABS 0.0 11/21/2015 1110     Treatments: surgery: see above  Discharge Exam: Blood pressure 140/66, pulse 64, temperature 98.4 F (36.9 C), temperature source Oral, resp. rate 18, height 5\' 5"  (1.651 m), weight 142 lb (64.4 kg), SpO2 100 %. General appearance: alert and cooperative Resp: clear to auscultation bilaterally Cardio: regular rate and rhythm, S1, S2 normal, no murmur, click, rub or gallop GI: soft, non-tender; bowel sounds normal; no masses,  no organomegaly and incisions clean dry and intact  Disposition: Final discharge disposition not confirmed  Discharge Instructions    (HEART FAILURE PATIENTS) Call MD:  Anytime you have any of the following symptoms: 1) 3 pound weight gain in 24 hours or 5 pounds in 1 week 2) shortness of breath, with or without a dry hacking cough 3) swelling in the hands, feet or stomach 4) if you have to sleep on extra pillows at night in order to breathe.    Complete by:  As directed   Call MD  for:  difficulty breathing, headache or visual disturbances    Complete by:  As directed   Call MD for:  extreme fatigue    Complete by:  As directed   Call MD for:  hives    Complete by:  As directed   Call MD for:  persistant dizziness or light-headedness    Complete by:  As directed   Call MD for:  persistant nausea and vomiting    Complete by:  As directed   Call MD for:  redness, tenderness, or signs of infection (pain, swelling, redness, odor or green/yellow discharge around incision site)    Complete by:  As directed   Call MD for:  severe uncontrolled pain    Complete by:  As directed   Call MD for:  temperature >100.4    Complete by:  As directed   Diet - low sodium heart healthy    Complete by:  As directed   Diet general    Complete by:  As directed   Driving Restrictions    Complete by:  As directed   No driving for 7 days or until off narcotic pain medication   Increase activity slowly    Complete by:  As directed   Remove dressing in 24 hours    Complete by:  As directed   Sexual Activity Restrictions    Complete by:  As directed   No intercourse for 6 weeks       Medication List  TAKE these medications   ALPRAZolam 0.25 MG tablet Commonly known as:  XANAX Take 1 tablet (0.25 mg total) by mouth at bedtime as needed for anxiety.   BEE POLLEN PO Take 1 tablet by mouth daily.   BILBERRY EXTRACT PO Take 1 tablet by mouth daily.   citalopram 20 MG tablet Commonly known as:  CELEXA Take 1 tablet (20 mg total) by mouth daily.   docusate sodium 100 MG capsule Commonly known as:  COLACE Take 1 capsule (100 mg total) by mouth 2 (two) times daily.   fluticasone 50 MCG/ACT nasal spray Commonly known as:  FLONASE Place 2 sprays into the nose daily. What changed:  when to take this  reasons to take this   LUTEIN PO Take 1 tablet by mouth daily.   oxyCODONE-acetaminophen 5-325 MG tablet Commonly known as:  PERCOCET Take 1-2 tablets by mouth every 4 (four) hours  as needed for severe pain.   Vitamin D (Ergocalciferol) 50000 units Caps capsule Commonly known as:  DRISDOL Take 1 capsule (50,000 Units total) by mouth every 7 (seven) days. What changed:  additional instructions   Vitamin D 2000 units tablet Take 2,000 Units by mouth daily.      Follow-up Information    Donaciano Eva, MD Follow up on 01/03/2016.   Specialty:  Obstetrics and Gynecology Why:  at 2:30pm at the [facility] information: [place] [address] [phone]           Signed: Donaciano Eva 11/27/2015, 11:17 AM

## 2015-11-27 NOTE — Discharge Instructions (Signed)
11/26/2015  Return to work: 4 weeks  Activity: 1. Be up and out of the bed during the day.  Take a nap if needed.  You may walk up steps but be careful and use the hand rail.  Stair climbing will tire you more than you think, you may need to stop part way and rest.   2. No lifting or straining for 6 weeks.  3. No driving for 1-2 weeks.  Do Not drive if you are taking narcotic pain medicine.  4. Shower daily.  Use soap and water on your incision and pat dry; don't rub.   5. No sexual activity and nothing in the vagina for 8 weeks.  Diet: 1. Low sodium Heart Healthy Diet is recommended.  2. It is safe to use a laxative if you have difficulty moving your bowels.   Wound Care: 1. Keep clean and dry.  Shower daily.  Reasons to call the Doctor:   Fever - Oral temperature greater than 100.4 degrees Fahrenheit  Foul-smelling vaginal discharge  Difficulty urinating  Nausea and vomiting  Increased pain at the site of the incision that is unrelieved with pain medicine.  Difficulty breathing with or without chest pain  New calf pain especially if only on one side  Sudden, continuing increased vaginal bleeding with or without clots.   Follow-up: 1. See Everitt Amber in 4 weeks.  Contacts: For questions or concerns you should contact:  Dr. Everitt Amber at [phone]  or at [place]

## 2016-01-03 ENCOUNTER — Ambulatory Visit: Payer: BLUE CROSS/BLUE SHIELD | Attending: Gynecologic Oncology | Admitting: Gynecologic Oncology

## 2016-01-03 ENCOUNTER — Encounter: Payer: Self-pay | Admitting: Gynecologic Oncology

## 2016-01-03 VITALS — BP 170/86 | HR 78 | Temp 98.3°F | Resp 18 | Ht 65.0 in | Wt 139.8 lb

## 2016-01-03 DIAGNOSIS — D259 Leiomyoma of uterus, unspecified: Secondary | ICD-10-CM | POA: Diagnosis not present

## 2016-01-03 DIAGNOSIS — Z90710 Acquired absence of both cervix and uterus: Secondary | ICD-10-CM | POA: Insufficient documentation

## 2016-01-03 DIAGNOSIS — Z90721 Acquired absence of ovaries, unilateral: Secondary | ICD-10-CM | POA: Diagnosis not present

## 2016-01-03 DIAGNOSIS — Z9889 Other specified postprocedural states: Secondary | ICD-10-CM | POA: Diagnosis not present

## 2016-01-03 DIAGNOSIS — N83202 Unspecified ovarian cyst, left side: Secondary | ICD-10-CM | POA: Diagnosis not present

## 2016-01-03 DIAGNOSIS — N838 Other noninflammatory disorders of ovary, fallopian tube and broad ligament: Secondary | ICD-10-CM

## 2016-01-03 NOTE — Progress Notes (Signed)
OPERATIVE NOTE 01/03/16  HPI:  Sharon Conrad is a 50 y.o. year old G0P0 initially seen in consultation on 11/08/15 for symtomatic uterine fibroids and a left ovarian cyst.  She then underwent a robotic assisted total hysterectomy for uterus >250gm, pelvic adhesiolysis, bilateral salpingectomy and left oophorectomy on [id number] without complications.  Her postoperative course was uncomplicated.  Her final pathology revealed a benign ovarian fibroma, and serous cyst and uterine fibroids with a uterine weight of 361gms. Surgery was complicated by severe adhesive disease in the pelvis.  She is seen today for a postoperative check and to discuss her pathology results and ongoing plan.  Since discharge from the hospital, she is feeling well.  She has improving appetite, normal bowel and bladder function, and pain controlled with minimal PO medication. She has no other complaints today.    Review of systems: Constitutional:  She has no weight gain or weight loss. She has no fever or chills. Eyes: No blurred vision Ears, Nose, Mouth, Throat: No dizziness, headaches or changes in hearing. No mouth sores. Cardiovascular: No chest pain, palpitations or edema. Respiratory:  No shortness of breath, wheezing or cough Gastrointestinal: She has normal bowel movements without diarrhea or constipation. She denies any nausea or vomiting. She denies blood in her stool or heart burn. Genitourinary:  She denies pelvic pain, pelvic pressure or changes in her urinary function. She has no hematuria, dysuria, or incontinence. She has no irregular vaginal bleeding or vaginal discharge Musculoskeletal: Denies muscle weakness or joint pains.  Skin:  She has no skin changes, rashes or itching Neurological:  Denies dizziness or headaches. No neuropathy, no numbness or tingling. Psychiatric:  She denies depression or anxiety. Hematologic/Lymphatic:   No easy bruising or bleeding   Physical Exam: Blood pressure (!) 170/86,  pulse 78, temperature 98.3 F (36.8 C), temperature source Oral, resp. rate 18, height 5\' 5"  (1.651 m), weight 139 lb 12.8 oz (63.4 kg), SpO2 98 %. General: Well dressed, well nourished in no apparent distress.   HEENT:  Normocephalic and atraumatic, no lesions.  Extraocular muscles intact. Sclerae anicteric. Pupils equal, round, reactive. No mouth sores or ulcers. Thyroid is normal size, not nodular, midline. Skin:  No lesions or rashes. Breasts:  Soft, symmetric.  No skin or nipple changes.  No palpable LN or masses. Lungs:  Clear to auscultation bilaterally.  No wheezes. Cardiovascular:  Regular rate and rhythm.  No murmurs or rubs. Abdomen:  Soft, nontender, nondistended.  No palpable masses.  No hepatosplenomegaly.  No ascites. Normal bowel sounds.  No hernias.  Incisions are healing normally Genitourinary: Normal EGBUS  Vaginal cuff intact.  No bleeding or discharge.  No cul de sac fullness. Extremities: No cyanosis, clubbing or edema.  No calf tenderness or erythema. No palpable cords. Psychiatric: Mood and affect are appropriate. Neurological: Awake, alert and oriented x 3. Sensation is intact, no neuropathy.  Musculoskeletal: No pain, normal strength and range of motion.  Assessment:    50 y.o. year old with a history of fibroids and benign ovarian cyst.   S/p robotic hysterectomy, LSO, right salpingectomy on 11/26/15.   Plan: 1) Pathology reports reviewed today 2) Treatment counseling - I discussed her pathology and operative findings. No specific follow-up required. Recommended avoiding intercourse from an additional 2 weeks. She was given the opportunity to ask questions, which were answered to her satisfaction, and she is agreement with the above mentioned plan of care.  3)  Return to clinic to see Dr Toney Rakes annually for  well woman care.  Donaciano Eva, MD

## 2016-01-03 NOTE — Patient Instructions (Signed)
Plan to follow up with your PCP and GYN for well woman care.  Please call for any questions or concerns.

## 2016-05-18 DIAGNOSIS — H3530 Unspecified macular degeneration: Secondary | ICD-10-CM | POA: Diagnosis not present

## 2016-09-16 ENCOUNTER — Encounter: Payer: Self-pay | Admitting: Gynecology

## 2016-10-26 ENCOUNTER — Encounter: Payer: Self-pay | Admitting: Obstetrics & Gynecology

## 2016-10-26 ENCOUNTER — Ambulatory Visit (INDEPENDENT_AMBULATORY_CARE_PROVIDER_SITE_OTHER): Payer: BLUE CROSS/BLUE SHIELD | Admitting: Obstetrics & Gynecology

## 2016-10-26 VITALS — BP 124/76 | Ht 64.5 in | Wt 145.0 lb

## 2016-10-26 DIAGNOSIS — N898 Other specified noninflammatory disorders of vagina: Secondary | ICD-10-CM

## 2016-10-26 DIAGNOSIS — N760 Acute vaginitis: Secondary | ICD-10-CM | POA: Diagnosis not present

## 2016-10-26 DIAGNOSIS — R5383 Other fatigue: Secondary | ICD-10-CM

## 2016-10-26 DIAGNOSIS — Z1151 Encounter for screening for human papillomavirus (HPV): Secondary | ICD-10-CM | POA: Diagnosis not present

## 2016-10-26 DIAGNOSIS — Z01411 Encounter for gynecological examination (general) (routine) with abnormal findings: Secondary | ICD-10-CM

## 2016-10-26 DIAGNOSIS — F419 Anxiety disorder, unspecified: Secondary | ICD-10-CM | POA: Diagnosis not present

## 2016-10-26 DIAGNOSIS — Z1231 Encounter for screening mammogram for malignant neoplasm of breast: Secondary | ICD-10-CM | POA: Diagnosis not present

## 2016-10-26 DIAGNOSIS — R232 Flushing: Secondary | ICD-10-CM

## 2016-10-26 LAB — TSH: TSH: 3.17 mIU/L

## 2016-10-26 MED ORDER — CITALOPRAM HYDROBROMIDE 20 MG PO TABS: 10.0000 mg | ORAL_TABLET | Freq: Every day | ORAL | 11 refills | Status: DC

## 2016-10-26 NOTE — Patient Instructions (Signed)
1. Encounter for gynecological examination with abnormal finding S/P TLH/LSO/Rt Salpingect/Rt wedge ovarian Bx/Cystectomy.  Pap/HPV HR done.  Breasts wnl.  Screening mammo today. - Hemoglobin A1c - Comp Met (CMET); Future - Lipid panel; Future - Vitamin D 1,25 dihydroxy; Future - Pap IG w/ reflex to HPV when ASC-U  2. Vaginal dryness Probably menopausal.  Will try Astroglide.  Replens PRN.  Will consider Estrogen cream if Menopause confirmed.  -[place]  3. Hot flashes Always rather hot.  R/O Menopause.  F/U to discuss HRT if Menopause confirmed. - [place]  4. Fatigue, unspecified type Recommended regular physical activity.  Possibly associated with Menopause.  R/O Hypothyroidism.  R/O Glucose intolerance.  No Depressive Sx. - FSH - TSH - Hemoglobin A1c  5. Anxiety Long standing Anxiety.  Worse around time of divorce.  Now considering weaning of Celexa.  Celexa represcribed, with advice on how to proceed with weaning.  Myrtle, it was a pleasure to meet you today!  I will inform you of your results as soon as available.

## 2016-10-26 NOTE — Progress Notes (Signed)
Sharon Conrad 21-Mar-1966 [id number]   History:    51 y.o. G0 Divorced.  Stable boyfriend x 3 years.  RP:  Established patient presenting for annual gyn exam   HPI:  S/P Robotic TLH/LSO/Rt Salpingectomy/Rt Ovarian Wedge Bx/Cystectomy with Dr Ledora Bottcher 11/2015.  Patho Benign, bilateral Ovarian Adenofibromas.  Since then feels tired most days.  Tends to be hot, unsure if having hot flashes.  No night sweats.  C/O Vaginal dryness with superficial Dyspareunia.  Breasts wnl.  Mictions/BMs wnl.  BMI 24.5.  Not exercising regularly.  Past medical history,surgical history, family history and social history were all reviewed and documented in the EPIC chart.  Gynecologic History Patient's last menstrual period was 09/06/2011. Contraception: status post hysterectomy Last Pap: 2016. Results were: normal Last mammogram: Today. Results were: Pending  Obstetric History OB History  Gravida Para Term Preterm AB Living  0            SAB TAB Ectopic Multiple Live Births                    ROS: A ROS was performed and pertinent positives and negatives are included in the history.  GENERAL: No fevers or chills. HEENT: No change in vision, no earache, sore throat or sinus congestion. NECK: No pain or stiffness. CARDIOVASCULAR: No chest pain or pressure. No palpitations. PULMONARY: No shortness of breath, cough or wheeze. GASTROINTESTINAL: No abdominal pain, nausea, vomiting or diarrhea, melena or bright red blood per rectum. GENITOURINARY: No urinary frequency, urgency, hesitancy or dysuria. MUSCULOSKELETAL: No joint or muscle pain, no back pain, no recent trauma. DERMATOLOGIC: No rash, no itching, no lesions. ENDOCRINE: No polyuria, polydipsia, no heat or cold intolerance. No recent change in weight. HEMATOLOGICAL: No anemia or easy bruising or bleeding. NEUROLOGIC: No headache, seizures, numbness, tingling or weakness. PSYCHIATRIC: No depression, no loss of interest in normal activity or change in  sleep pattern.     Exam:   BP 124/76   Ht 5' 4.5" (1.638 m)   Wt 145 lb (65.8 kg)   LMP 09/06/2011   BMI 24.50 kg/m   Body mass index is 24.5 kg/m.  General appearance : Well developed well nourished female. No acute distress HEENT: Eyes: no retinal hemorrhage or exudates,  Neck supple, trachea midline, no carotid bruits, no thyroidmegaly Lungs: Clear to auscultation, no rhonchi or wheezes, or rib retractions  Heart: Regular rate and rhythm, no murmurs or gallops Breast:Examined in sitting and supine position were symmetrical in appearance, no palpable masses or tenderness,  no skin retraction, no nipple inversion, no nipple discharge, no skin discoloration, no axillary or supraclavicular lymphadenopathy Abdomen: no palpable masses or tenderness, no rebound or guarding Extremities: no edema or skin discoloration or tenderness  Pelvic:  Bartholin, Urethra, Skene Glands: Within normal limits             Vagina: No gross lesions or discharge.  Pap/HPV done.  Cervix/Uterus: Surgically absent  Adnexa  Without masses or tenderness  Anus and perineum  normal    Assessment/Plan:  51 y.o. female for annual exam   1. Encounter for gynecological examination with abnormal finding S/P TLH/LSO/Rt Salpingect/Rt wedge ovarian Bx/Cystectomy.  Pap/HPV HR done.  Breasts wnl.  Screening mammo today. - Hemoglobin A1c - Comp Met (CMET); Future - Lipid panel; Future - Vitamin D 1,25 dihydroxy; Future - Pap IG w/ reflex to HPV when ASC-U  2. Vaginal dryness Probably menopausal.  Will try Astroglide.  Replens PRN.  Will  consider Estrogen cream if Menopause confirmed.  -[place]  3. Hot flashes Always rather hot.  R/O Menopause.  F/U to discuss HRT if Menopause confirmed. - [place]  4. Fatigue, unspecified type Recommended regular physical activity.  Possibly associated with Menopause.  R/O Hypothyroidism.  R/O Glucose intolerance.  No Depressive Sx. - FSH - TSH - Hemoglobin A1c  5.  Anxiety Long standing Anxiety.  Worse around time of divorce.  Now considering weaning of Celexa.  Celexa represcribed, with advice on how to proceed with weaning.  Counseling on above issues >50% x 15 minutes.   Princess Bruins MD, 11:05 AM 10/26/2016

## 2016-10-26 NOTE — Addendum Note (Signed)
Addended by: Thurnell Garbe A on: 10/26/2016 12:34 PM   Modules accepted: Orders

## 2016-10-27 ENCOUNTER — Other Ambulatory Visit: Payer: BLUE CROSS/BLUE SHIELD

## 2016-10-27 DIAGNOSIS — Z01411 Encounter for gynecological examination (general) (routine) with abnormal findings: Secondary | ICD-10-CM

## 2016-10-27 LAB — COMPREHENSIVE METABOLIC PANEL
ALT: 13 U/L (ref 6–29)
AST: 19 U/L (ref 10–35)
Albumin: 4.3 g/dL (ref 3.6–5.1)
Alkaline Phosphatase: 70 U/L (ref 33–130)
BUN: 18 mg/dL (ref 7–25)
CHLORIDE: 105 mmol/L (ref 98–110)
CO2: 24 mmol/L (ref 20–31)
Calcium: 9.6 mg/dL (ref 8.6–10.4)
Creat: 0.82 mg/dL (ref 0.50–1.05)
GLUCOSE: 92 mg/dL (ref 65–99)
Potassium: 4.2 mmol/L (ref 3.5–5.3)
Sodium: 138 mmol/L (ref 135–146)
TOTAL PROTEIN: 7 g/dL (ref 6.1–8.1)
Total Bilirubin: 0.4 mg/dL (ref 0.2–1.2)

## 2016-10-27 LAB — LIPID PANEL
CHOL/HDL RATIO: 3.1 ratio (ref <5.0)
CHOLESTEROL: 213 mg/dL — AB (ref <200)
HDL: 69 mg/dL (ref >50)
LDL Cholesterol: 133 mg/dL — ABNORMAL HIGH (ref <100)
Triglycerides: 55 mg/dL (ref <150)
VLDL: 11 mg/dL (ref <30)

## 2016-10-27 LAB — FOLLICLE STIMULATING HORMONE: FSH: 149.4 m[IU]/mL — ABNORMAL HIGH

## 2016-10-27 LAB — HEMOGLOBIN A1C
Hgb A1c MFr Bld: 5.5 % (ref <5.7)
Mean Plasma Glucose: 111 mg/dL

## 2016-10-29 LAB — PAP, TP IMAGING W/ HPV RNA, RFLX HPV TYPE 16,18/45: HPV mRNA, High Risk: NOT DETECTED

## 2016-10-29 LAB — VITAMIN D 1,25 DIHYDROXY
VITAMIN D3 1, 25 (OH): 52 pg/mL
Vitamin D 1, 25 (OH)2 Total: 65 pg/mL (ref 18–72)
Vitamin D2 1, 25 (OH)2: 13 pg/mL

## 2017-08-23 DIAGNOSIS — M2550 Pain in unspecified joint: Secondary | ICD-10-CM | POA: Diagnosis not present

## 2017-08-23 DIAGNOSIS — R5383 Other fatigue: Secondary | ICD-10-CM | POA: Diagnosis not present

## 2017-08-23 DIAGNOSIS — M25572 Pain in left ankle and joints of left foot: Secondary | ICD-10-CM | POA: Diagnosis not present

## 2017-08-23 DIAGNOSIS — M25532 Pain in left wrist: Secondary | ICD-10-CM | POA: Diagnosis not present

## 2017-09-20 DIAGNOSIS — M25539 Pain in unspecified wrist: Secondary | ICD-10-CM | POA: Diagnosis not present

## 2017-09-20 DIAGNOSIS — M79642 Pain in left hand: Secondary | ICD-10-CM | POA: Diagnosis not present

## 2017-09-20 DIAGNOSIS — M06842 Other specified rheumatoid arthritis, left hand: Secondary | ICD-10-CM | POA: Diagnosis not present

## 2017-09-20 DIAGNOSIS — M06841 Other specified rheumatoid arthritis, right hand: Secondary | ICD-10-CM | POA: Diagnosis not present

## 2017-09-20 DIAGNOSIS — M79641 Pain in right hand: Secondary | ICD-10-CM | POA: Diagnosis not present

## 2017-09-20 DIAGNOSIS — M549 Dorsalgia, unspecified: Secondary | ICD-10-CM | POA: Diagnosis not present

## 2017-09-20 DIAGNOSIS — M064 Inflammatory polyarthropathy: Secondary | ICD-10-CM | POA: Diagnosis not present

## 2017-09-20 DIAGNOSIS — M47816 Spondylosis without myelopathy or radiculopathy, lumbar region: Secondary | ICD-10-CM | POA: Diagnosis not present

## 2017-09-20 DIAGNOSIS — D8989 Other specified disorders involving the immune mechanism, not elsewhere classified: Secondary | ICD-10-CM | POA: Diagnosis not present

## 2017-09-20 DIAGNOSIS — M2550 Pain in unspecified joint: Secondary | ICD-10-CM | POA: Diagnosis not present

## 2017-10-08 DIAGNOSIS — M064 Inflammatory polyarthropathy: Secondary | ICD-10-CM | POA: Diagnosis not present

## 2017-10-08 DIAGNOSIS — M25539 Pain in unspecified wrist: Secondary | ICD-10-CM | POA: Diagnosis not present

## 2017-10-08 DIAGNOSIS — M2550 Pain in unspecified joint: Secondary | ICD-10-CM | POA: Diagnosis not present

## 2017-10-08 DIAGNOSIS — M549 Dorsalgia, unspecified: Secondary | ICD-10-CM | POA: Diagnosis not present

## 2017-12-20 ENCOUNTER — Encounter: Payer: Self-pay | Admitting: Obstetrics & Gynecology

## 2017-12-20 ENCOUNTER — Ambulatory Visit: Payer: BLUE CROSS/BLUE SHIELD | Admitting: Obstetrics & Gynecology

## 2017-12-20 VITALS — BP 132/86 | Ht 64.25 in | Wt 141.0 lb

## 2017-12-20 DIAGNOSIS — Z90710 Acquired absence of both cervix and uterus: Secondary | ICD-10-CM | POA: Diagnosis not present

## 2017-12-20 DIAGNOSIS — Z01419 Encounter for gynecological examination (general) (routine) without abnormal findings: Secondary | ICD-10-CM

## 2017-12-20 DIAGNOSIS — Z780 Asymptomatic menopausal state: Secondary | ICD-10-CM

## 2017-12-20 NOTE — Progress Notes (Signed)
Sharon Conrad March 02, 1966 [id number]   History:    52 y.o. G0 Divorced.  Stable boyfriend x 4 1/2 yrs.  RP:  Established patient presenting for annual gyn exam   HPI:   S/P Robotic TLH/LSO/Rt Salpingectomy/Rt Ovarian Wedge Bx/Cystectomy with Dr Ledora Bottcher 11/2015.  Patho Benign, bilateral Ovarian Adenofibromas.  Menopausal, well on black cohosh. Doing much better than last year with good energy level now.  Off Celexa.  No pelvic pain.  No pain with intercourse.  Urine and bowel movements normal.  Breasts normal.  Health labs with family physician.  Colonoscopy in 2016.  Past medical history,surgical history, family history and social history were all reviewed and documented in the EPIC chart.  Gynecologic History Patient's last menstrual period was 09/06/2011. Contraception: status post hysterectomy Last Pap: 10/2016. Results were: Negative, HPV HR neg Last mammogram: 10/2016. Results were: Negative Bone Density: Never Colonoscopy: 04/2015  Obstetric History OB History  Gravida Para Term Preterm AB Living  0            SAB TAB Ectopic Multiple Live Births                ROS: A ROS was performed and pertinent positives and negatives are included in the history.  GENERAL: No fevers or chills. HEENT: No change in vision, no earache, sore throat or sinus congestion. NECK: No pain or stiffness. CARDIOVASCULAR: No chest pain or pressure. No palpitations. PULMONARY: No shortness of breath, cough or wheeze. GASTROINTESTINAL: No abdominal pain, nausea, vomiting or diarrhea, melena or bright red blood per rectum. GENITOURINARY: No urinary frequency, urgency, hesitancy or dysuria. MUSCULOSKELETAL: No joint or muscle pain, no back pain, no recent trauma. DERMATOLOGIC: No rash, no itching, no lesions. ENDOCRINE: No polyuria, polydipsia, no heat or cold intolerance. No recent change in weight. HEMATOLOGICAL: No anemia or easy bruising or bleeding. NEUROLOGIC: No headache, seizures, numbness, tingling  or weakness. PSYCHIATRIC: No depression, no loss of interest in normal activity or change in sleep pattern.     Exam:   BP 132/86   Ht 5' 4.25" (1.632 m)   Wt 141 lb (64 kg)   LMP 09/06/2011   BMI 24.01 kg/m   Body mass index is 24.01 kg/m.  General appearance : Well developed well nourished female. No acute distress HEENT: Eyes: no retinal hemorrhage or exudates,  Neck supple, trachea midline, no carotid bruits, no thyroidmegaly Lungs: Clear to auscultation, no rhonchi or wheezes, or rib retractions  Heart: Regular rate and rhythm, no murmurs or gallops Breast:Examined in sitting and supine position were symmetrical in appearance, no palpable masses or tenderness,  no skin retraction, no nipple inversion, no nipple discharge, no skin discoloration, no axillary or supraclavicular lymphadenopathy Abdomen: no palpable masses or tenderness, no rebound or guarding Extremities: no edema or skin discoloration or tenderness  Pelvic: Vulva: Normal             Vagina: No gross lesions or discharge  Cervix/Uterus absent  Adnexa  Without masses or tenderness  Anus: Normal   Assessment/Plan:  52 y.o. female for annual exam   1. Well female exam with routine gynecological exam Gynecologic exam status post total hysterectomy and left salpingo-oophorectomy.  Pap test negative with negative high-risk HPV in June 2018.  No indication to repeat this year.  Breast exam normal.  Last mammogram was negative in June 2018.  Patient will schedule repeat screening mammogram now.  Colonoscopy in 2016.  Health labs with family physician.  2. H/O  total hysterectomy  3. Menopause present Well on no hormone replacement therapy.  Controls mild vasomotor symptoms with black cohosh.  Vitamin D supplements, intake of calcium at 1.5 g/day including nutritional and supplemental calcium recommended.  Regular weightbearing physical activity recommended.  Other orders - Black Cohosh 160 MG CAPS; Take by mouth. -  Probiotic Product (PROBIOTIC-10 PO); Take by mouth.  Princess Bruins MD, 12:32 PM 12/20/2017

## 2017-12-21 ENCOUNTER — Encounter: Payer: Self-pay | Admitting: Obstetrics & Gynecology

## 2017-12-21 NOTE — Patient Instructions (Signed)
1. Well female exam with routine gynecological exam Gynecologic exam status post total hysterectomy and left salpingo-oophorectomy.  Pap test negative with negative high-risk HPV in June 2018.  No indication to repeat this year.  Breast exam normal.  Last mammogram was negative in June 2018.  Patient will schedule repeat screening mammogram now.  Colonoscopy in 2016.  Health labs with family physician.  2. H/O total hysterectomy  3. Menopause present Well on no hormone replacement therapy.  Controls mild vasomotor symptoms with black cohosh.  Vitamin D supplements, intake of calcium at 1.5 g/day including nutritional and supplemental calcium recommended.  Regular weightbearing physical activity recommended.  Other orders - Black Cohosh 160 MG CAPS; Take by mouth. - Probiotic Product (PROBIOTIC-10 PO); Take by mouth.  Jazelyn, it was a pleasure seeing you today!

## 2018-03-14 DIAGNOSIS — F419 Anxiety disorder, unspecified: Secondary | ICD-10-CM | POA: Diagnosis not present

## 2018-03-14 DIAGNOSIS — Z0000 Encounter for general adult medical examination without abnormal findings: Secondary | ICD-10-CM | POA: Diagnosis not present

## 2018-03-14 DIAGNOSIS — E559 Vitamin D deficiency, unspecified: Secondary | ICD-10-CM | POA: Diagnosis not present

## 2018-03-14 DIAGNOSIS — E785 Hyperlipidemia, unspecified: Secondary | ICD-10-CM | POA: Diagnosis not present

## 2018-03-28 DIAGNOSIS — Z1231 Encounter for screening mammogram for malignant neoplasm of breast: Secondary | ICD-10-CM | POA: Diagnosis not present

## 2019-07-10 ENCOUNTER — Ambulatory Visit: Payer: BLUE CROSS/BLUE SHIELD | Attending: Internal Medicine

## 2019-07-10 DIAGNOSIS — Z23 Encounter for immunization: Secondary | ICD-10-CM | POA: Insufficient documentation

## 2019-07-10 NOTE — Progress Notes (Signed)
   Covid-19 Vaccination Clinic  Name:  Sharon Conrad    MRN: [id number] DOB: 1965-08-15  07/10/2019  [name] was observed post Covid-19 immunization for 30 minutes based on pre-vaccination screening without incident. She was provided with Vaccine Information Sheet and instruction to access the V-Safe system.   [name] was instructed to call 911 with any severe reactions post vaccine: Marland Kitchen Difficulty breathing  . Swelling of face and throat  . A fast heartbeat  . A bad rash all over body  . Dizziness and weakness   Immunizations Administered    Name Date Dose VIS Date Route   Pfizer COVID-19 Vaccine 07/10/2019  3:15 PM 0.3 mL 04/14/2019 Intramuscular   Manufacturer: [place]   Lot: [id number]   [place]: [id number]

## 2019-08-15 ENCOUNTER — Ambulatory Visit: Payer: BLUE CROSS/BLUE SHIELD | Attending: Internal Medicine

## 2019-08-15 DIAGNOSIS — Z23 Encounter for immunization: Secondary | ICD-10-CM

## 2019-08-15 NOTE — Progress Notes (Signed)
   Covid-19 Vaccination Clinic  Name:  LENDER PASOS    MRN: [id number] DOB: 1965-05-12  08/15/2019  Ms. Lampkin was observed post Covid-19 immunization for 15 minutes without incident. She was provided with Vaccine Information Sheet and instruction to access the V-Safe system.   Ms. Santamarina was instructed to call 911 with any severe reactions post vaccine: Marland Kitchen Difficulty breathing  . Swelling of face and throat  . A fast heartbeat  . A bad rash all over body  . Dizziness and weakness   Immunizations Administered    Name Date Dose VIS Date Route   Pfizer COVID-19 Vaccine 08/15/2019  9:57 AM 0.3 mL 04/14/2019 Intramuscular   Manufacturer: [place]   Lot: [id number]   [place]: [id number]

## 2020-02-26 ENCOUNTER — Ambulatory Visit: Payer: BLUE CROSS/BLUE SHIELD

## 2020-02-27 ENCOUNTER — Ambulatory Visit: Payer: BLUE CROSS/BLUE SHIELD | Attending: Internal Medicine

## 2020-02-27 DIAGNOSIS — Z23 Encounter for immunization: Secondary | ICD-10-CM

## 2020-02-27 NOTE — Progress Notes (Signed)
° °  Covid-19 Vaccination Clinic  Name:  Sharon Conrad    MRN: [id number] DOB: [birth year]  02/27/2020  Sharon Conrad was observed post Covid-19 immunization for 15 minutes without incident. She was provided with Vaccine Information Sheet and instruction to access the V-Safe system.   Sharon Conrad was instructed to call 911 with any severe reactions post vaccine:  Difficulty breathing   Swelling of face and throat   A fast heartbeat   A bad rash all over body   Dizziness and weakness

## 2020-12-27 ENCOUNTER — Other Ambulatory Visit: Payer: Self-pay | Admitting: Family Medicine

## 2020-12-27 DIAGNOSIS — Z1231 Encounter for screening mammogram for malignant neoplasm of breast: Secondary | ICD-10-CM

## 2021-01-10 ENCOUNTER — Other Ambulatory Visit: Payer: Self-pay

## 2021-01-10 ENCOUNTER — Ambulatory Visit
Admission: RE | Admit: 2021-01-10 | Discharge: 2021-01-10 | Disposition: A | Discharge status: Home / Self Care | Payer: BC Managed Care – PPO | Source: Ambulatory Visit | Attending: Family Medicine | Admitting: Family Medicine

## 2021-01-10 DIAGNOSIS — Z1231 Encounter for screening mammogram for malignant neoplasm of breast: Secondary | ICD-10-CM | POA: Diagnosis not present

## 2021-02-13 DIAGNOSIS — I10 Essential (primary) hypertension: Secondary | ICD-10-CM | POA: Diagnosis not present

## 2021-02-13 DIAGNOSIS — R002 Palpitations: Secondary | ICD-10-CM | POA: Diagnosis not present

## 2021-02-25 DIAGNOSIS — R002 Palpitations: Secondary | ICD-10-CM | POA: Diagnosis not present

## 2021-02-25 DIAGNOSIS — R7989 Other specified abnormal findings of blood chemistry: Secondary | ICD-10-CM | POA: Diagnosis not present

## 2021-03-04 DIAGNOSIS — E0590 Thyrotoxicosis, unspecified without thyrotoxic crisis or storm: Secondary | ICD-10-CM | POA: Diagnosis not present

## 2021-03-26 DIAGNOSIS — F411 Generalized anxiety disorder: Secondary | ICD-10-CM | POA: Diagnosis not present

## 2021-03-26 DIAGNOSIS — B001 Herpesviral vesicular dermatitis: Secondary | ICD-10-CM | POA: Diagnosis not present

## 2021-03-26 DIAGNOSIS — I10 Essential (primary) hypertension: Secondary | ICD-10-CM | POA: Diagnosis not present

## 2021-04-22 ENCOUNTER — Encounter: Payer: Self-pay | Admitting: Obstetrics & Gynecology

## 2021-04-22 ENCOUNTER — Other Ambulatory Visit (HOSPITAL_COMMUNITY)
Admission: RE | Admit: 2021-04-22 | Discharge: 2021-04-22 | Disposition: A | Discharge status: Home / Self Care | Payer: BC Managed Care – PPO | Source: Ambulatory Visit | Attending: Obstetrics & Gynecology | Admitting: Obstetrics & Gynecology

## 2021-04-22 ENCOUNTER — Ambulatory Visit (INDEPENDENT_AMBULATORY_CARE_PROVIDER_SITE_OTHER): Payer: BC Managed Care – PPO | Admitting: Obstetrics & Gynecology

## 2021-04-22 ENCOUNTER — Other Ambulatory Visit: Payer: Self-pay

## 2021-04-22 VITALS — BP 118/78 | HR 81 | Ht 64.5 in | Wt 135.0 lb

## 2021-04-22 DIAGNOSIS — Z780 Asymptomatic menopausal state: Secondary | ICD-10-CM | POA: Diagnosis not present

## 2021-04-22 DIAGNOSIS — Z01419 Encounter for gynecological examination (general) (routine) without abnormal findings: Secondary | ICD-10-CM | POA: Insufficient documentation

## 2021-04-22 DIAGNOSIS — Z1272 Encounter for screening for malignant neoplasm of vagina: Secondary | ICD-10-CM

## 2021-04-22 DIAGNOSIS — Z90710 Acquired absence of both cervix and uterus: Secondary | ICD-10-CM | POA: Diagnosis not present

## 2021-04-22 DIAGNOSIS — R339 Retention of urine, unspecified: Secondary | ICD-10-CM | POA: Diagnosis not present

## 2021-04-22 NOTE — Progress Notes (Signed)
KESTREL MIS 09-08-1965 [id number]   History:    55 y.o. G0 Divorced.  Stable boyfriend x 8 yrs, engagement soon. RP:  New (>3 yrs) patient presenting for annual gyn exam  HPI:   S/P Robotic TLH/LSO/Rt Salpingectomy/Rt Ovarian Wedge Bx/Cystectomy with Dr Ledora Bottcher 11/2015.  Patho Benign, bilateral Ovarian Adenofibromas.  Pap Neg in 2018.  Postmenopausal, well on no HRT. No pelvic pain.  No pain with intercourse.  Using a moisturizing cream on the vulva.  Tendency for incomplete emptying of the bladder, has to go back to finish emptying a few minutes later.  No urinary incontinence.  Bowel movements normal.  Breasts normal.  Mammo Neg 01/2021. BMI 22.81.  Good fitness. Health labs with family physician.  Followed by Endocrino for mild Thyroid dysfunction.  Colonoscopy in 2016, on a 10 yr schedule.   Past medical history,surgical history, family history and social history were all reviewed and documented in the EPIC chart.  Gynecologic History Patient's last menstrual period was 09/06/2011.  Obstetric History OB History  Gravida Para Term Preterm AB Living  0            SAB IAB Ectopic Multiple Live Births                ROS: A ROS was performed and pertinent positives and negatives are included in the history.  GENERAL: No fevers or chills. HEENT: No change in vision, no earache, sore throat or sinus congestion. NECK: No pain or stiffness. CARDIOVASCULAR: No chest pain or pressure. No palpitations. PULMONARY: No shortness of breath, cough or wheeze. GASTROINTESTINAL: No abdominal pain, nausea, vomiting or diarrhea, melena or bright red blood per rectum. GENITOURINARY: No urinary frequency, urgency, hesitancy or dysuria. MUSCULOSKELETAL: No joint or muscle pain, no back pain, no recent trauma. DERMATOLOGIC: No rash, no itching, no lesions. ENDOCRINE: No polyuria, polydipsia, no heat or cold intolerance. No recent change in weight. HEMATOLOGICAL: No anemia or easy bruising or bleeding.  NEUROLOGIC: No headache, seizures, numbness, tingling or weakness. PSYCHIATRIC: No depression, no loss of interest in normal activity or change in sleep pattern.     Exam:   BP 118/78    Pulse 81    Ht 5' 4.5" (1.638 m)    Wt 135 lb (61.2 kg)    LMP 09/06/2011    SpO2 98%    BMI 22.81 kg/m   Body mass index is 22.81 kg/m.  General appearance : Well developed well nourished female. No acute distress HEENT: Eyes: no retinal hemorrhage or exudates,  Neck supple, trachea midline, no carotid bruits, no thyroidmegaly Lungs: Clear to auscultation, no rhonchi or wheezes, or rib retractions  Heart: Regular rate and rhythm, no murmurs or gallops Breast:Examined in sitting and supine position were symmetrical in appearance, no palpable masses or tenderness,  no skin retraction, no nipple inversion, no nipple discharge, no skin discoloration, no axillary or supraclavicular lymphadenopathy Abdomen: no palpable masses or tenderness, no rebound or guarding Extremities: no edema or skin discoloration or tenderness  Pelvic: Vulva: Normal             Vagina: No gross lesions or discharge.  Pap reflex done.  Cervix/Uterus absent  Adnexa  Without masses or tenderness  Anus: Normal   Assessment/Plan:  55 y.o. female for annual exam   1. Encounter for Papanicolaou smear of vagina as part of routine gynecological examination  S/P Robotic TLH/LSO/Rt Salpingectomy/Rt Ovarian Wedge Bx/Cystectomy with Dr Ledora Bottcher 11/2015.  Patho Benign, bilateral Ovarian Adenofibromas.  Pap Neg in 2018.  Pap reflex done today. Postmenopausal, well on no HRT. No pelvic pain.  No pain with intercourse.  Using a moisturizing cream on the vulva.  Tendency for incomplete emptying of the bladder, has to go back to finish emptying a few minutes later.  No urinary incontinence.  Bowel movements normal.  Breasts normal.  Mammo Neg 01/2021. BMI 22.81.  Good fitness. Health labs with family physician.  Followed by Endocrino for mild Thyroid  dysfunction.  Colonoscopy in 2016, on a 10 yr schedule. - Cytology - PAP( [facility])  2. S/P total hysterectomy  3. Postmenopause Well on no HRT.  Moisturizing cream to vulva.  Vit D, Ca++, regular wt bearing physical activities.  4. Incomplete bladder emptying Counseling done.  Frequent regular emptying of the bladder to avoid overdistention. Kegel exercises recommended.  Will refer to Urology for Urodynamic testing if worsening Sxs.    Other orders - acyclovir (ZOVIRAX) 400 MG tablet; TAKE ONE TABLET BY MOUTH THREE TIMES A DAY FOR 5 DAYS PRN - busPIRone (BUSPAR) 5 MG tablet; Take 1 tablet by mouth 2 (two) times daily. - Cholecalciferol 10 MCG (400 UNIT) CAPS; Take by mouth. - olmesartan-hydrochlorothiazide (BENICAR HCT) 20-12.5 MG tablet; Take 1 tablet by mouth daily. - PRESCRIPTION MEDICATION; Progesterone cream - OVER THE COUNTER MEDICATION; adaptocrine - OVER THE COUNTER MEDICATION; Adrenacalm cream - OVER THE COUNTER MEDICATION; Thyroid Calming - OVER THE COUNTER MEDICATION; Raw Calcium/Magnesium - OVER THE COUNTER MEDICATION; Ozyme Ultra (Digestion)   Princess Bruins MD, 8:21 AM 04/22/2021

## 2021-04-24 LAB — CYTOLOGY - PAP: Diagnosis: NEGATIVE

## 2021-05-09 DIAGNOSIS — Z23 Encounter for immunization: Secondary | ICD-10-CM | POA: Diagnosis not present

## 2021-06-06 DIAGNOSIS — R6889 Other general symptoms and signs: Secondary | ICD-10-CM | POA: Diagnosis not present

## 2021-06-06 DIAGNOSIS — R768 Other specified abnormal immunological findings in serum: Secondary | ICD-10-CM | POA: Diagnosis not present

## 2021-06-06 DIAGNOSIS — E0590 Thyrotoxicosis, unspecified without thyrotoxic crisis or storm: Secondary | ICD-10-CM | POA: Diagnosis not present

## 2021-07-25 DIAGNOSIS — L03011 Cellulitis of right finger: Secondary | ICD-10-CM | POA: Diagnosis not present

## 2021-07-29 DIAGNOSIS — X58XXXA Exposure to other specified factors, initial encounter: Secondary | ICD-10-CM | POA: Diagnosis not present

## 2021-07-29 DIAGNOSIS — L03011 Cellulitis of right finger: Secondary | ICD-10-CM | POA: Diagnosis not present

## 2021-07-29 DIAGNOSIS — S60121A Contusion of right index finger with damage to nail, initial encounter: Secondary | ICD-10-CM | POA: Diagnosis not present

## 2021-08-04 DIAGNOSIS — X58XXXA Exposure to other specified factors, initial encounter: Secondary | ICD-10-CM | POA: Diagnosis not present

## 2021-08-04 DIAGNOSIS — S61302A Unspecified open wound of right middle finger with damage to nail, initial encounter: Secondary | ICD-10-CM | POA: Diagnosis not present

## 2021-09-09 DIAGNOSIS — E0590 Thyrotoxicosis, unspecified without thyrotoxic crisis or storm: Secondary | ICD-10-CM | POA: Diagnosis not present

## 2021-09-09 DIAGNOSIS — R768 Other specified abnormal immunological findings in serum: Secondary | ICD-10-CM | POA: Diagnosis not present

## 2021-09-10 DIAGNOSIS — Z23 Encounter for immunization: Secondary | ICD-10-CM | POA: Diagnosis not present

## 2021-09-19 DIAGNOSIS — F411 Generalized anxiety disorder: Secondary | ICD-10-CM | POA: Diagnosis not present

## 2021-09-19 DIAGNOSIS — I10 Essential (primary) hypertension: Secondary | ICD-10-CM | POA: Diagnosis not present

## 2021-12-16 DIAGNOSIS — E0590 Thyrotoxicosis, unspecified without thyrotoxic crisis or storm: Secondary | ICD-10-CM | POA: Diagnosis not present

## 2021-12-16 DIAGNOSIS — R768 Other specified abnormal immunological findings in serum: Secondary | ICD-10-CM | POA: Diagnosis not present

## 2022-01-06 DIAGNOSIS — S61213A Laceration without foreign body of left middle finger without damage to nail, initial encounter: Secondary | ICD-10-CM | POA: Diagnosis not present

## 2022-02-20 DIAGNOSIS — Z0000 Encounter for general adult medical examination without abnormal findings: Secondary | ICD-10-CM | POA: Diagnosis not present

## 2022-02-26 ENCOUNTER — Other Ambulatory Visit: Payer: Self-pay | Admitting: Physician Assistant

## 2022-02-26 DIAGNOSIS — Z1231 Encounter for screening mammogram for malignant neoplasm of breast: Secondary | ICD-10-CM

## 2022-03-13 DIAGNOSIS — E0590 Thyrotoxicosis, unspecified without thyrotoxic crisis or storm: Secondary | ICD-10-CM | POA: Diagnosis not present

## 2022-03-13 DIAGNOSIS — R768 Other specified abnormal immunological findings in serum: Secondary | ICD-10-CM | POA: Diagnosis not present

## 2022-03-21 DIAGNOSIS — U071 COVID-19: Secondary | ICD-10-CM | POA: Diagnosis not present

## 2022-04-03 DIAGNOSIS — F411 Generalized anxiety disorder: Secondary | ICD-10-CM | POA: Diagnosis not present

## 2022-04-03 DIAGNOSIS — I10 Essential (primary) hypertension: Secondary | ICD-10-CM | POA: Diagnosis not present

## 2022-04-03 DIAGNOSIS — Z0000 Encounter for general adult medical examination without abnormal findings: Secondary | ICD-10-CM | POA: Diagnosis not present

## 2022-04-17 ENCOUNTER — Ambulatory Visit
Admission: RE | Admit: 2022-04-17 | Discharge: 2022-04-17 | Disposition: A | Discharge status: Home / Self Care | Payer: BC Managed Care – PPO | Source: Ambulatory Visit | Attending: Physician Assistant | Admitting: Physician Assistant

## 2022-04-17 DIAGNOSIS — Z1231 Encounter for screening mammogram for malignant neoplasm of breast: Secondary | ICD-10-CM | POA: Diagnosis not present

## 2022-06-25 IMAGING — MG MM DIGITAL SCREENING BILAT W/ TOMO AND CAD
8 series · 9 of 24 positions shown · non-contrast
Comparison: Previous exam(s).

CLINICAL DATA: Screening.

EXAM:
DIGITAL SCREENING BILATERAL MAMMOGRAM WITH TOMOSYNTHESIS AND CAD
TECHNIQUE: Bilateral screening digital craniocaudal and mediolateral oblique
mammograms were obtained. Bilateral screening digital breast
tomosynthesis was performed. The images were evaluated with
computer-aided detection.

[L CC synth-2D]
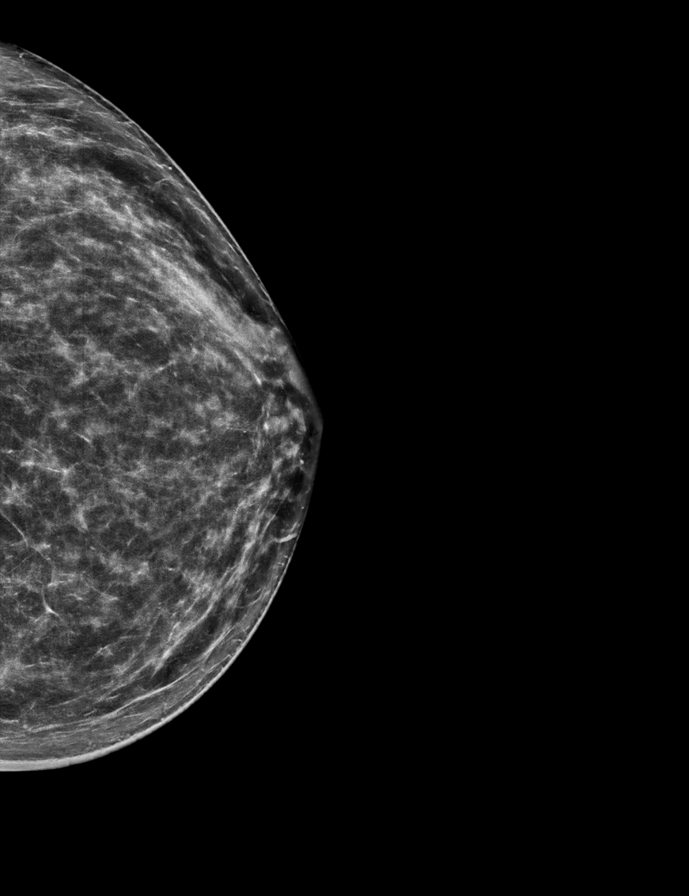

[L MLO synth-2D]
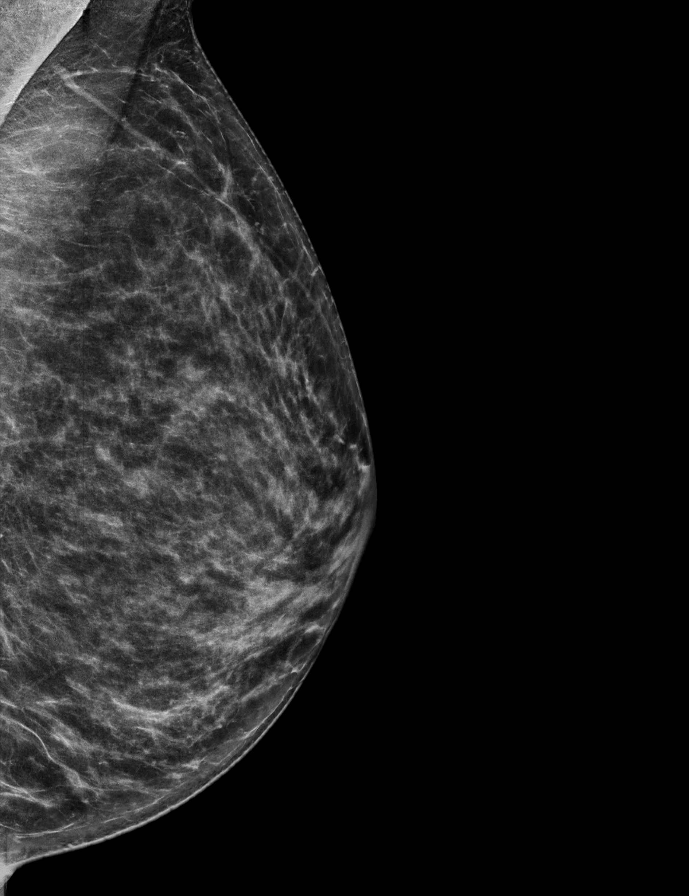

[R CC synth-2D]
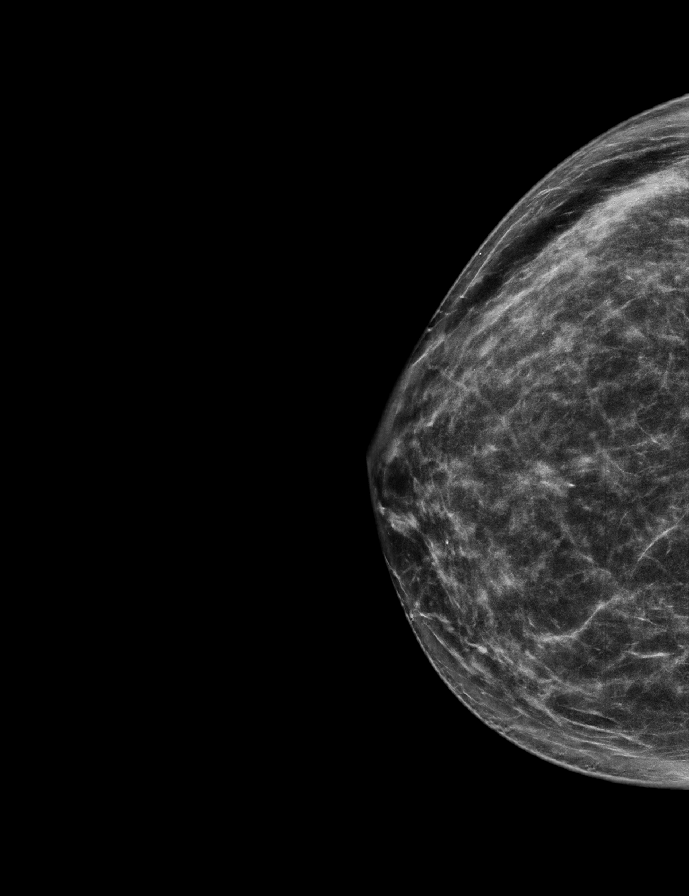

[R MLO synth-2D]
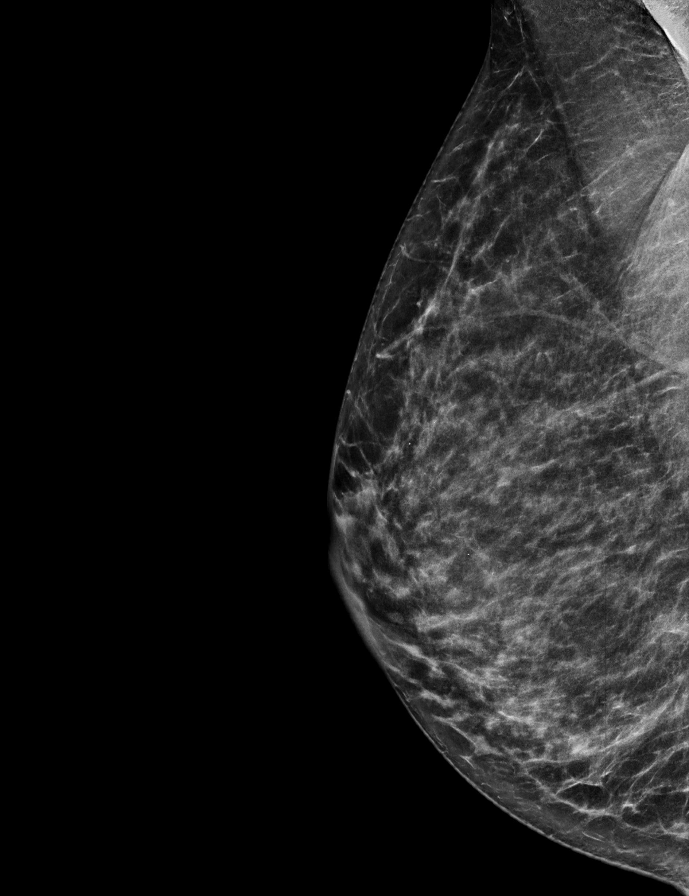

[L CC tomo · 2 of 70 frames shown]
[frame 23/70]
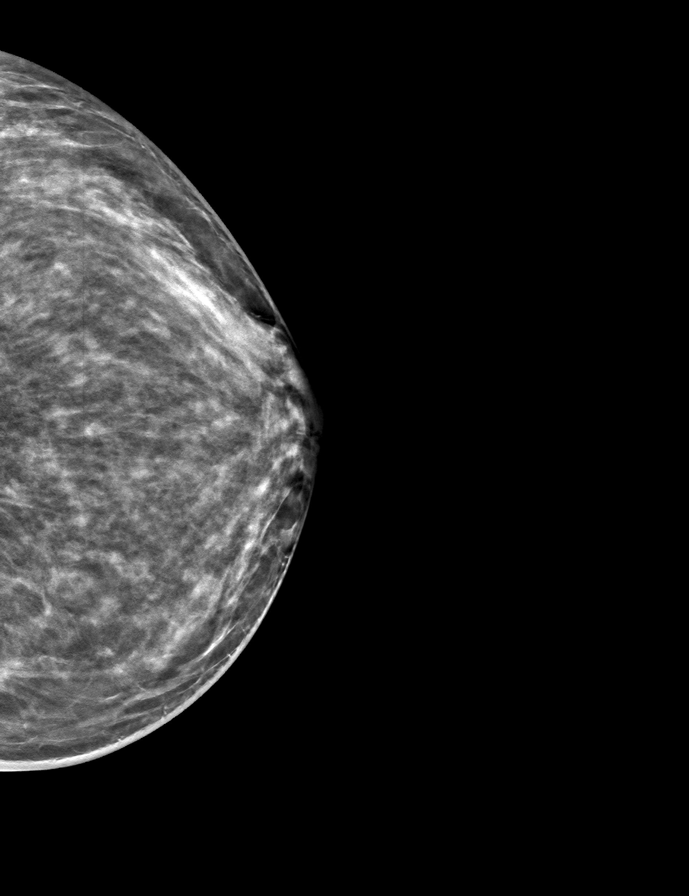
[frame 35/70]
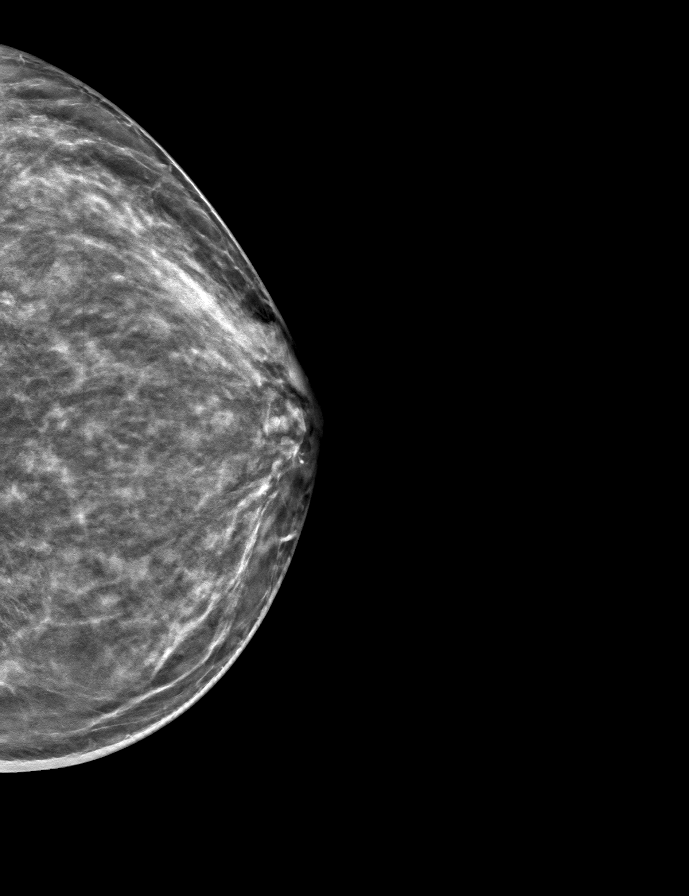

[L MLO tomo · tomo slice 35/68.0]
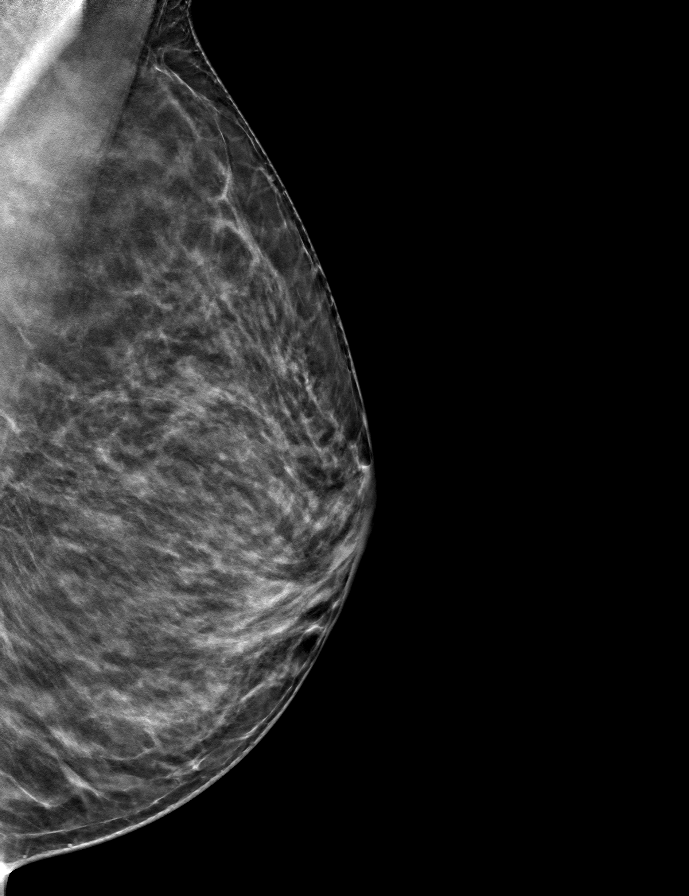

[R MLO tomo · tomo slice 35/69.0]
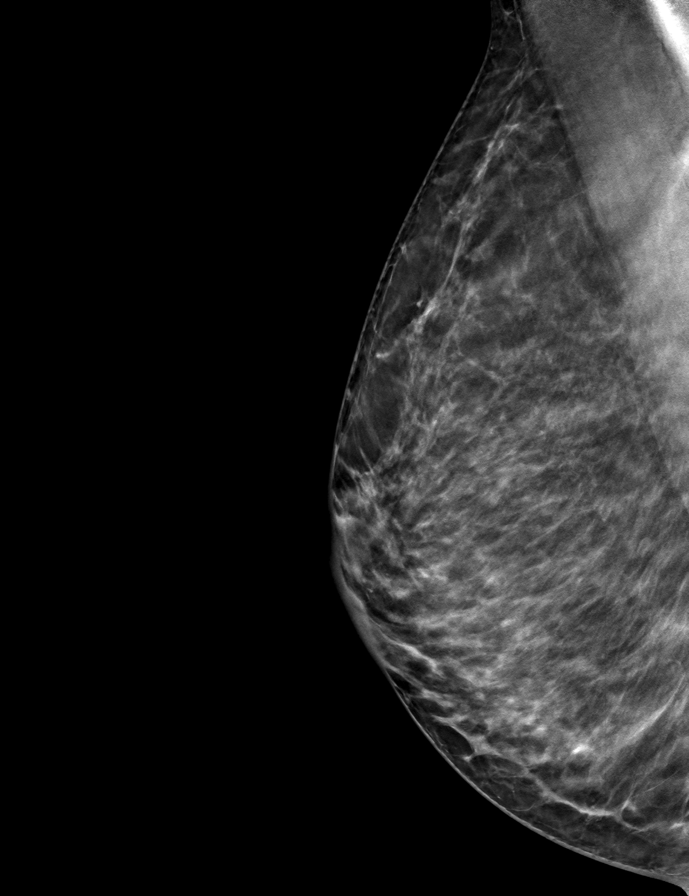

[R CC tomo · tomo slice 36/71.0]
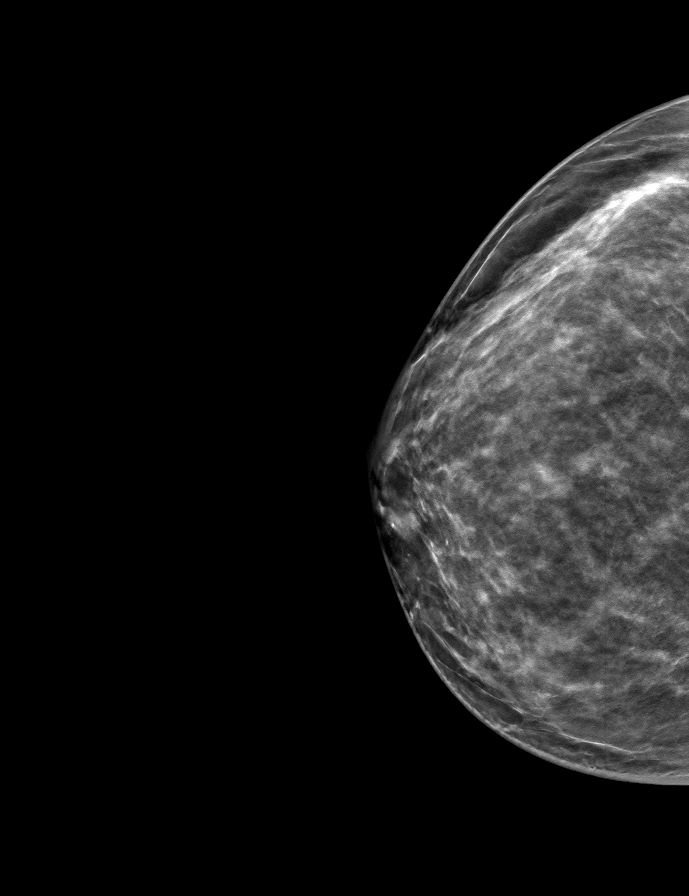

[9 of 24 positions shown; findings below may reference images not displayed]

ACR Breast Density Category c: The breast tissue is heterogeneously
dense, which may obscure small masses.
FINDINGS: There are no findings suspicious for malignancy.
IMPRESSION: No mammographic evidence of malignancy. A result letter of this
screening mammogram will be mailed directly to the patient.

RECOMMENDATION:
Screening mammogram in one year. (Code:Q3-W-BC3)

BI-RADS CATEGORY  1: Negative.

## 2022-09-11 DIAGNOSIS — E0590 Thyrotoxicosis, unspecified without thyrotoxic crisis or storm: Secondary | ICD-10-CM | POA: Diagnosis not present

## 2022-09-11 DIAGNOSIS — R768 Other specified abnormal immunological findings in serum: Secondary | ICD-10-CM | POA: Diagnosis not present
